# Patient Record
Sex: Female | Born: 1940
Health system: Southern US, Community
[De-identification: ages and names within clinical notes are randomized; demographics above are authoritative.]

## PROBLEM LIST (undated history)

## (undated) DIAGNOSIS — M1990 Unspecified osteoarthritis, unspecified site: Secondary | ICD-10-CM

## (undated) DIAGNOSIS — M810 Age-related osteoporosis without current pathological fracture: Secondary | ICD-10-CM

## (undated) DIAGNOSIS — I10 Essential (primary) hypertension: Secondary | ICD-10-CM

## (undated) DIAGNOSIS — E079 Disorder of thyroid, unspecified: Secondary | ICD-10-CM

## (undated) DIAGNOSIS — M797 Fibromyalgia: Secondary | ICD-10-CM

## (undated) DIAGNOSIS — M353 Polymyalgia rheumatica: Secondary | ICD-10-CM

## (undated) DIAGNOSIS — I739 Peripheral vascular disease, unspecified: Secondary | ICD-10-CM

## (undated) HISTORY — DX: Disorder of thyroid, unspecified: E07.9

## (undated) HISTORY — PX: BREAST LUMPECTOMY: SHX2

## (undated) HISTORY — DX: Polymyalgia rheumatica: M35.3

## (undated) HISTORY — PX: ABDOMINAL HYSTERECTOMY: SHX81

## (undated) HISTORY — DX: Fibromyalgia: M79.7

## (undated) HISTORY — PX: KNEE CARTILAGE SURGERY: SHX688

## (undated) HISTORY — DX: Unspecified osteoarthritis, unspecified site: M19.90

## (undated) HISTORY — DX: Peripheral vascular disease, unspecified: I73.9

## (undated) HISTORY — DX: Essential (primary) hypertension: I10

## (undated) HISTORY — DX: Age-related osteoporosis without current pathological fracture: M81.0

---

## 2000-02-05 ENCOUNTER — Emergency Department (HOSPITAL_COMMUNITY): Admission: EM | Admit: 2000-02-05 | Discharge: 2000-02-05 | Payer: Self-pay | Admitting: Emergency Medicine

## 2000-07-06 ENCOUNTER — Emergency Department (HOSPITAL_COMMUNITY): Admission: EM | Admit: 2000-07-06 | Discharge: 2000-07-06 | Payer: Self-pay

## 2001-01-04 ENCOUNTER — Encounter: Admission: RE | Admit: 2001-01-04 | Discharge: 2001-01-04 | Payer: Self-pay | Admitting: Orthopedic Surgery

## 2001-01-04 ENCOUNTER — Encounter: Payer: Self-pay | Admitting: Orthopedic Surgery

## 2011-09-11 DIAGNOSIS — J019 Acute sinusitis, unspecified: Secondary | ICD-10-CM | POA: Diagnosis not present

## 2011-09-11 DIAGNOSIS — R05 Cough: Secondary | ICD-10-CM | POA: Diagnosis not present

## 2011-09-11 DIAGNOSIS — R059 Cough, unspecified: Secondary | ICD-10-CM | POA: Diagnosis not present

## 2011-10-25 DIAGNOSIS — I10 Essential (primary) hypertension: Secondary | ICD-10-CM | POA: Diagnosis not present

## 2011-10-25 DIAGNOSIS — E785 Hyperlipidemia, unspecified: Secondary | ICD-10-CM | POA: Diagnosis not present

## 2011-10-25 DIAGNOSIS — Z79899 Other long term (current) drug therapy: Secondary | ICD-10-CM | POA: Diagnosis not present

## 2011-10-25 DIAGNOSIS — K219 Gastro-esophageal reflux disease without esophagitis: Secondary | ICD-10-CM | POA: Diagnosis not present

## 2011-10-25 DIAGNOSIS — M353 Polymyalgia rheumatica: Secondary | ICD-10-CM | POA: Diagnosis not present

## 2011-10-25 DIAGNOSIS — E039 Hypothyroidism, unspecified: Secondary | ICD-10-CM | POA: Diagnosis not present

## 2011-11-23 DIAGNOSIS — H1030 Unspecified acute conjunctivitis, unspecified eye: Secondary | ICD-10-CM | POA: Diagnosis not present

## 2011-12-27 DIAGNOSIS — M25569 Pain in unspecified knee: Secondary | ICD-10-CM | POA: Diagnosis not present

## 2011-12-27 DIAGNOSIS — M1710 Unilateral primary osteoarthritis, unspecified knee: Secondary | ICD-10-CM | POA: Diagnosis not present

## 2011-12-29 DIAGNOSIS — M069 Rheumatoid arthritis, unspecified: Secondary | ICD-10-CM | POA: Diagnosis not present

## 2011-12-29 DIAGNOSIS — M353 Polymyalgia rheumatica: Secondary | ICD-10-CM | POA: Diagnosis not present

## 2011-12-29 DIAGNOSIS — Z79899 Other long term (current) drug therapy: Secondary | ICD-10-CM | POA: Diagnosis not present

## 2011-12-29 DIAGNOSIS — M159 Polyosteoarthritis, unspecified: Secondary | ICD-10-CM | POA: Diagnosis not present

## 2012-01-30 DIAGNOSIS — I998 Other disorder of circulatory system: Secondary | ICD-10-CM | POA: Diagnosis not present

## 2012-02-08 DIAGNOSIS — H1030 Unspecified acute conjunctivitis, unspecified eye: Secondary | ICD-10-CM | POA: Diagnosis not present

## 2012-03-16 DIAGNOSIS — I10 Essential (primary) hypertension: Secondary | ICD-10-CM | POA: Diagnosis not present

## 2012-03-16 DIAGNOSIS — R0602 Shortness of breath: Secondary | ICD-10-CM | POA: Diagnosis not present

## 2012-03-22 DIAGNOSIS — R1900 Intra-abdominal and pelvic swelling, mass and lump, unspecified site: Secondary | ICD-10-CM | POA: Diagnosis not present

## 2012-03-23 DIAGNOSIS — R0602 Shortness of breath: Secondary | ICD-10-CM | POA: Diagnosis not present

## 2012-04-03 DIAGNOSIS — R0602 Shortness of breath: Secondary | ICD-10-CM | POA: Diagnosis not present

## 2012-04-27 DIAGNOSIS — E785 Hyperlipidemia, unspecified: Secondary | ICD-10-CM | POA: Diagnosis not present

## 2012-04-27 DIAGNOSIS — F341 Dysthymic disorder: Secondary | ICD-10-CM | POA: Diagnosis not present

## 2012-04-27 DIAGNOSIS — M353 Polymyalgia rheumatica: Secondary | ICD-10-CM | POA: Diagnosis not present

## 2012-04-27 DIAGNOSIS — I10 Essential (primary) hypertension: Secondary | ICD-10-CM | POA: Diagnosis not present

## 2012-04-27 DIAGNOSIS — Z79899 Other long term (current) drug therapy: Secondary | ICD-10-CM | POA: Diagnosis not present

## 2012-04-27 DIAGNOSIS — E039 Hypothyroidism, unspecified: Secondary | ICD-10-CM | POA: Diagnosis not present

## 2012-04-27 DIAGNOSIS — R233 Spontaneous ecchymoses: Secondary | ICD-10-CM | POA: Diagnosis not present

## 2012-05-30 DIAGNOSIS — M25559 Pain in unspecified hip: Secondary | ICD-10-CM | POA: Diagnosis not present

## 2012-05-30 DIAGNOSIS — M25569 Pain in unspecified knee: Secondary | ICD-10-CM | POA: Diagnosis not present

## 2012-05-30 DIAGNOSIS — M1610 Unilateral primary osteoarthritis, unspecified hip: Secondary | ICD-10-CM | POA: Diagnosis not present

## 2012-05-30 DIAGNOSIS — M87059 Idiopathic aseptic necrosis of unspecified femur: Secondary | ICD-10-CM | POA: Diagnosis not present

## 2012-06-06 DIAGNOSIS — M069 Rheumatoid arthritis, unspecified: Secondary | ICD-10-CM | POA: Diagnosis not present

## 2012-06-06 DIAGNOSIS — M1610 Unilateral primary osteoarthritis, unspecified hip: Secondary | ICD-10-CM | POA: Diagnosis not present

## 2012-06-06 DIAGNOSIS — M353 Polymyalgia rheumatica: Secondary | ICD-10-CM | POA: Diagnosis not present

## 2012-06-06 DIAGNOSIS — Z79899 Other long term (current) drug therapy: Secondary | ICD-10-CM | POA: Diagnosis not present

## 2012-06-10 DIAGNOSIS — Z23 Encounter for immunization: Secondary | ICD-10-CM | POA: Diagnosis not present

## 2012-07-28 DIAGNOSIS — H903 Sensorineural hearing loss, bilateral: Secondary | ICD-10-CM | POA: Diagnosis not present

## 2012-09-13 DIAGNOSIS — Z79899 Other long term (current) drug therapy: Secondary | ICD-10-CM | POA: Diagnosis not present

## 2012-09-13 DIAGNOSIS — M069 Rheumatoid arthritis, unspecified: Secondary | ICD-10-CM | POA: Diagnosis not present

## 2012-09-13 DIAGNOSIS — M353 Polymyalgia rheumatica: Secondary | ICD-10-CM | POA: Diagnosis not present

## 2012-10-31 DIAGNOSIS — I10 Essential (primary) hypertension: Secondary | ICD-10-CM | POA: Diagnosis not present

## 2012-10-31 DIAGNOSIS — E039 Hypothyroidism, unspecified: Secondary | ICD-10-CM | POA: Diagnosis not present

## 2012-10-31 DIAGNOSIS — M353 Polymyalgia rheumatica: Secondary | ICD-10-CM | POA: Diagnosis not present

## 2012-10-31 DIAGNOSIS — K219 Gastro-esophageal reflux disease without esophagitis: Secondary | ICD-10-CM | POA: Diagnosis not present

## 2012-10-31 DIAGNOSIS — E785 Hyperlipidemia, unspecified: Secondary | ICD-10-CM | POA: Diagnosis not present

## 2012-10-31 DIAGNOSIS — Z79899 Other long term (current) drug therapy: Secondary | ICD-10-CM | POA: Diagnosis not present

## 2012-11-06 DIAGNOSIS — L0291 Cutaneous abscess, unspecified: Secondary | ICD-10-CM | POA: Diagnosis not present

## 2012-11-06 DIAGNOSIS — J309 Allergic rhinitis, unspecified: Secondary | ICD-10-CM | POA: Diagnosis not present

## 2012-11-06 DIAGNOSIS — L0390 Cellulitis, unspecified: Secondary | ICD-10-CM | POA: Diagnosis not present

## 2012-11-13 DIAGNOSIS — M79609 Pain in unspecified limb: Secondary | ICD-10-CM | POA: Diagnosis not present

## 2012-11-13 DIAGNOSIS — M2040 Other hammer toe(s) (acquired), unspecified foot: Secondary | ICD-10-CM | POA: Diagnosis not present

## 2012-11-13 DIAGNOSIS — B351 Tinea unguium: Secondary | ICD-10-CM | POA: Diagnosis not present

## 2012-11-13 DIAGNOSIS — L608 Other nail disorders: Secondary | ICD-10-CM | POA: Diagnosis not present

## 2012-11-13 DIAGNOSIS — M2010 Hallux valgus (acquired), unspecified foot: Secondary | ICD-10-CM | POA: Diagnosis not present

## 2012-12-26 DIAGNOSIS — M8700 Idiopathic aseptic necrosis of unspecified bone: Secondary | ICD-10-CM | POA: Diagnosis not present

## 2012-12-26 DIAGNOSIS — M1610 Unilateral primary osteoarthritis, unspecified hip: Secondary | ICD-10-CM | POA: Diagnosis not present

## 2012-12-26 DIAGNOSIS — M25559 Pain in unspecified hip: Secondary | ICD-10-CM | POA: Diagnosis not present

## 2012-12-27 DIAGNOSIS — M069 Rheumatoid arthritis, unspecified: Secondary | ICD-10-CM | POA: Diagnosis not present

## 2012-12-27 DIAGNOSIS — M353 Polymyalgia rheumatica: Secondary | ICD-10-CM | POA: Diagnosis not present

## 2012-12-27 DIAGNOSIS — M8700 Idiopathic aseptic necrosis of unspecified bone: Secondary | ICD-10-CM | POA: Diagnosis not present

## 2012-12-27 DIAGNOSIS — Z79899 Other long term (current) drug therapy: Secondary | ICD-10-CM | POA: Diagnosis not present

## 2013-01-06 DIAGNOSIS — A5059 Other late congenital syphilis, symptomatic: Secondary | ICD-10-CM | POA: Diagnosis not present

## 2013-01-07 DIAGNOSIS — R21 Rash and other nonspecific skin eruption: Secondary | ICD-10-CM | POA: Diagnosis not present

## 2013-01-19 DIAGNOSIS — L03039 Cellulitis of unspecified toe: Secondary | ICD-10-CM | POA: Diagnosis not present

## 2013-01-19 DIAGNOSIS — L02619 Cutaneous abscess of unspecified foot: Secondary | ICD-10-CM | POA: Diagnosis not present

## 2013-03-13 DIAGNOSIS — Z23 Encounter for immunization: Secondary | ICD-10-CM | POA: Diagnosis not present

## 2013-03-26 DIAGNOSIS — R109 Unspecified abdominal pain: Secondary | ICD-10-CM | POA: Diagnosis not present

## 2013-04-18 DIAGNOSIS — R109 Unspecified abdominal pain: Secondary | ICD-10-CM | POA: Diagnosis not present

## 2013-04-23 DIAGNOSIS — S8990XA Unspecified injury of unspecified lower leg, initial encounter: Secondary | ICD-10-CM | POA: Diagnosis not present

## 2013-04-23 DIAGNOSIS — S99929A Unspecified injury of unspecified foot, initial encounter: Secondary | ICD-10-CM | POA: Diagnosis not present

## 2013-05-08 ENCOUNTER — Ambulatory Visit (INDEPENDENT_AMBULATORY_CARE_PROVIDER_SITE_OTHER): Payer: Medicare Other | Admitting: Family Medicine

## 2013-05-08 ENCOUNTER — Encounter (INDEPENDENT_AMBULATORY_CARE_PROVIDER_SITE_OTHER): Payer: Self-pay

## 2013-05-08 ENCOUNTER — Encounter: Payer: Self-pay | Admitting: Family Medicine

## 2013-05-08 VITALS — BP 128/64 | HR 83 | Temp 97.0°F | Ht 62.0 in | Wt 100.6 lb

## 2013-05-08 DIAGNOSIS — M069 Rheumatoid arthritis, unspecified: Secondary | ICD-10-CM | POA: Diagnosis not present

## 2013-05-08 DIAGNOSIS — IMO0001 Reserved for inherently not codable concepts without codable children: Secondary | ICD-10-CM | POA: Diagnosis not present

## 2013-05-08 DIAGNOSIS — M1990 Unspecified osteoarthritis, unspecified site: Secondary | ICD-10-CM

## 2013-05-08 DIAGNOSIS — Z13220 Encounter for screening for lipoid disorders: Secondary | ICD-10-CM | POA: Diagnosis not present

## 2013-05-08 DIAGNOSIS — E079 Disorder of thyroid, unspecified: Secondary | ICD-10-CM | POA: Insufficient documentation

## 2013-05-08 DIAGNOSIS — Z1239 Encounter for other screening for malignant neoplasm of breast: Secondary | ICD-10-CM | POA: Insufficient documentation

## 2013-05-08 DIAGNOSIS — M797 Fibromyalgia: Secondary | ICD-10-CM | POA: Insufficient documentation

## 2013-05-08 DIAGNOSIS — M129 Arthropathy, unspecified: Secondary | ICD-10-CM | POA: Diagnosis not present

## 2013-05-08 DIAGNOSIS — Z23 Encounter for immunization: Secondary | ICD-10-CM | POA: Diagnosis not present

## 2013-05-08 DIAGNOSIS — M810 Age-related osteoporosis without current pathological fracture: Secondary | ICD-10-CM | POA: Diagnosis not present

## 2013-05-08 DIAGNOSIS — I10 Essential (primary) hypertension: Secondary | ICD-10-CM | POA: Insufficient documentation

## 2013-05-08 LAB — POCT CBC
Granulocyte percent: 54.3 %G (ref 37–80)
HCT, POC: 39.2 % (ref 37.7–47.9)
Hemoglobin: 13 g/dL (ref 12.2–16.2)
Lymph, poc: 2.4 (ref 0.6–3.4)
MCH, POC: 32.4 pg — AB (ref 27–31.2)
MCHC: 33.3 g/dL (ref 31.8–35.4)
MCV: 97.2 fL — AB (ref 80–97)
MPV: 7.6 fL (ref 0–99.8)
POC Granulocyte: 3.1 (ref 2–6.9)
POC LYMPH PERCENT: 41.8 %L (ref 10–50)
Platelet Count, POC: 239 10*3/uL (ref 142–424)
RBC: 4 M/uL — AB (ref 4.04–5.48)
RDW, POC: 13.1 %
WBC: 5.7 10*3/uL (ref 4.6–10.2)

## 2013-05-08 NOTE — Patient Instructions (Signed)
      Dr Woodroe Mode Recommendations  For nutrition information, I recommend books:  1).Eat to Live by Dr Monico Hoar. 2).Prevent and Reverse Heart Disease by Dr Suzzette Righter. 3) Dr Katherina Right Book:  Program to Reverse Diabetes 4) The [place] Study by Florene Route  Exercise recommendations are:  If unable to walk, then the patient can exercise in a chair 3 times a day. By flapping arms like a bird gently and raising legs outwards to the front.  If ambulatory, the patient can go for walks for 30 minutes 3 times a week. Then increase the intensity and duration as tolerated.  Goal is to try to attain exercise frequency to 5 times a week.  If applicable: Best to perform resistance exercises (machines or weights) 2 days a week and cardio type exercises 3 days per week.   See Dr Dereck Ligas, Gynecologist.  I will schedule mammogram & DEXA.  A small study on Alzheimers at [employer], a serving or 2 of high quality fish weekly, Vit B12 daily, Vitamin B complex daily, Vit D minimum 2000 Units daily, Omega-3 fish oil 3 gm daily. Bigger study being done.  Return in 4months.

## 2013-05-08 NOTE — Progress Notes (Signed)
SUBJECTIVE: CC: Chief Complaint  Patient presents with  . Establish Care    HPI: Tonya Robinson is well known to me who came to re-establish for long term care.she drove all the way from [place]. She brings her summarized notes and needs preventative care and routine follow up. She had labwork Past Medical History  Diagnosis Date  . Tissue necrosis with gangrene in peripheral vascular disease RIGHT HIP  . Polymyalgia rheumatica   . Arthritis   . Hypertension   . Fibromyalgia   . Osteoporosis   . Thyroid disease    Past Surgical History  Procedure Laterality Date  . Knee cartilage surgery Right   . Breast lumpectomy Right   . Abdominal hysterectomy     History   Social History  . Marital Status: Divorced    Spouse Name: N/A    Number of Children: N/A  . Years of Education: N/A   Occupational History  . Not on file.   Social History Main Topics  . Smoking status: Former Smoker -- 0.25 packs/day    Types: Cigarettes  . Smokeless tobacco: Not on file  . Alcohol Use: 0.6 oz/week    1 Glasses of wine per week  . Drug Use: No  . Sexual Activity: Not on file   Other Topics Concern  . Not on file   Social History Narrative  . No narrative on file   Family History  Problem Relation Age of Onset  . Hypertension Mother   . Arthritis Mother   . Heart disease Father   . Arthritis Father    No current outpatient prescriptions on file prior to visit.   No current facility-administered medications on file prior to visit.   Allergies  Allergen Reactions  . Penicillins Rash   Immunization History  Administered Date(s) Administered  . DTaP 04/09/2010  . Pneumococcal Conjugate 05/08/2013  . Pneumococcal Polysaccharide 05/10/2010   Prior to Admission medications   Not on File     ROS: As above in the HPI. All other systems are stable or negative.  OBJECTIVE: APPEARANCE:  Patient in no acute distress.The patient appeared well nourished and normally developed.  Acyanotic. Waist: VITAL SIGNS:BP 128/64  Pulse 83  Temp(Src) 97 F (36.1 C) (Oral)  Ht 5\' 2"  (1.575 m)  Wt 100 lb 9.6 oz (45.632 kg)  BMI 18.40 kg/m2 WF slim built  SKIN: warm and  Dry without overt rashes, tattoos and scars  HEAD and Neck: without JVD, Head and scalp: normal Eyes:No scleral icterus. Fundi normal, eye movements normal. Ears: Auricle normal, canal normal, Tympanic membranes normal, insufflation normal. Nose: normal Throat: normal Neck & thyroid: normal  CHEST & LUNGS: Chest wall: normal Lungs: Clear  CVS: Reveals the PMI to be normally located. Regular rhythm, First and Second Heart sounds are normal,  absence of murmurs, rubs or gallops. Peripheral vasculature: Radial pulses: normal Dorsal pedis pulses: normal Posterior pulses: normal  ABDOMEN:  Appearance: normal Benign, no organomegaly, no masses, no Abdominal Aortic enlargement. No Guarding , no rebound. No Bruits. Bowel sounds: normal  RECTAL: N/A GU: N/A  EXTREMETIES: nonedematous.  MUSCULOSKELETAL:  Spine: normal Joints: intact  NEUROLOGIC: oriented to time,place and person; nonfocal.  ASSESSMENT:  Arthritis  Fibromyalgia  Thyroid disease - Plan: TSH  Osteoporosis - Plan: Vit D  25 hydroxy (rtn osteoporosis monitoring)  Rheumatoid arthritis - Plan: POCT CBC, Sedimentation rate  HTN (hypertension) - Plan: CMP14+EGFR  Screening, lipid - Plan: Lipid panel  Need for prophylactic vaccination against Streptococcus pneumoniae (  pneumococcus) - Plan: Pneumococcal conjugate vaccine 13-valent less than 5yo IM  PLAN:  Orders Placed This Encounter  Procedures  . Pneumococcal conjugate vaccine 13-valent less than 5yo IM  . CMP14+EGFR  . Lipid panel  . TSH  . Sedimentation rate  . Vit D  25 hydroxy (rtn osteoporosis monitoring)  . POCT CBC   Meds ordered this encounter  Medications  . levothyroxine (SYNTHROID, LEVOTHROID) 25 MCG tablet    Sig: Take 25 mcg by mouth daily  before breakfast.  . FLUoxetine (PROZAC) 10 MG capsule    Sig: Take 10 mg by mouth daily.  . hydroxychloroquine (PLAQUENIL) 200 MG tablet    Sig: Take by mouth 2 (two) times daily.  Marland Kitchen DISCONTD: lisinopril (PRINIVIL,ZESTRIL) 10 MG tablet    Sig: Take 10 mg by mouth daily. TAKES 5MG  QD  . predniSONE (DELTASONE) 1 MG tablet    Sig: Take 1 mg by mouth daily. TAKE 3 QD  . estrogens, conjugated, (PREMARIN) 0.625 MG tablet    Sig: Take 0.625 mg by mouth daily. ONE PO Monday through friday  . aspirin 81 MG tablet    Sig: Take 81 mg by mouth daily.  . calcium carbonate (OS-CAL) 600 MG TABS tablet    Sig: Take 600 mg by mouth daily with breakfast.  . fish oil-omega-3 fatty acids 1000 MG capsule    Sig: Take 2 g by mouth daily.  Marland Kitchen zinc gluconate 50 MG tablet    Sig: Take 50 mg by mouth daily.  Marland Kitchen acetaminophen (TYLENOL) 80 MG/0.8ML suspension    Sig: Take 10 mg/kg by mouth every 4 (four) hours as needed for fever.  Marland Kitchen acetaminophen (TYLENOL) 500 MG tablet    Sig: Take 500 mg by mouth every 6 (six) hours as needed for pain.  Marland Kitchen ALPRAZolam (XANAX) 0.25 MG tablet    Sig: Take 0.25 mg by mouth at bedtime as needed for sleep. TAKES ONE QID PRN  . HYDROcodone-acetaminophen (NORCO/VICODIN) 5-325 MG per tablet    Sig: Take 2 tablets by mouth every 6 (six) hours as needed for pain.  Marland Kitchen lisinopril (PRINIVIL,ZESTRIL) 5 MG tablet    Sig: Take 5 mg by mouth daily.   Medications Discontinued During This Encounter  Medication Reason  . lisinopril (PRINIVIL,ZESTRIL) 10 MG tablet Error      Dr Woodroe Mode Recommendations  For nutrition information, I recommend books:  1).Eat to Live by Dr Monico Hoar. 2).Prevent and Reverse Heart Disease by Dr Suzzette Righter. 3) Dr Katherina Right Book:  Program to Reverse Diabetes 4) The [place] Study by Florene Route  Exercise recommendations are:  If unable to walk, then the patient can exercise in a chair 3 times a day. By flapping arms like a bird gently and  raising legs outwards to the front.  If ambulatory, the patient can go for walks for 30 minutes 3 times a week. Then increase the intensity and duration as tolerated.  Goal is to try to attain exercise frequency to 5 times a week.  If applicable: Best to perform resistance exercises (machines or weights) 2 days a week and cardio type exercises 3 days per week.   See Dr Dereck Ligas, Gynecologist.  I will schedule mammogram & DEXA.  A small study on Alzheimers at [employer], a serving or 2 of high quality fish weekly, Vit B12 daily, Vitamin B complex daily, Vit D minimum 2000 Units daily, Omega-3 fish oil 3 gm daily. Bigger study being done.  Return in about 4 months (around 09/05/2013) for Recheck medical problems.  Oluwatobiloba Martin P. [place] Charon, M.D.

## 2013-05-09 LAB — LIPID PANEL
Chol/HDL Ratio: 1.7 ratio units (ref 0.0–4.4)
Cholesterol, Total: 209 mg/dL — ABNORMAL HIGH (ref 100–199)
HDL: 126 mg/dL (ref >39)
LDL Calculated: 68 mg/dL (ref 0–99)
Triglycerides: 76 mg/dL (ref 0–149)
VLDL Cholesterol Cal: 15 mg/dL (ref 5–40)

## 2013-05-09 LAB — CMP14+EGFR
ALT: 12 IU/L (ref 0–32)
AST: 24 IU/L (ref 0–40)
Albumin/Globulin Ratio: 2.4 (ref 1.1–2.5)
Albumin: 4.1 g/dL (ref 3.5–4.8)
Alkaline Phosphatase: 37 IU/L — ABNORMAL LOW (ref 39–117)
BUN/Creatinine Ratio: 18 (ref 11–26)
BUN: 16 mg/dL (ref 8–27)
CO2: 26 mmol/L (ref 18–29)
Calcium: 9.5 mg/dL (ref 8.6–10.2)
Chloride: 100 mmol/L (ref 97–108)
Creatinine, Ser: 0.91 mg/dL (ref 0.57–1.00)
GFR calc Af Amer: 73 mL/min/{1.73_m2} (ref >59)
GFR calc non Af Amer: 64 mL/min/{1.73_m2} (ref >59)
Globulin, Total: 1.7 g/dL (ref 1.5–4.5)
Glucose: 94 mg/dL (ref 65–99)
Potassium: 4.2 mmol/L (ref 3.5–5.2)
Sodium: 141 mmol/L (ref 134–144)
Total Bilirubin: 0.6 mg/dL (ref 0.0–1.2)
Total Protein: 5.8 g/dL — ABNORMAL LOW (ref 6.0–8.5)

## 2013-05-09 LAB — TSH: TSH: 3.35 u[IU]/mL (ref 0.450–4.500)

## 2013-05-09 LAB — VITAMIN D 25 HYDROXY (VIT D DEFICIENCY, FRACTURES): Vit D, 25-Hydroxy: 53.4 ng/mL (ref 30.0–100.0)

## 2013-05-09 LAB — SEDIMENTATION RATE: Sed Rate: 2 mm/hr (ref 0–40)

## 2013-05-11 ENCOUNTER — Encounter: Payer: Self-pay | Admitting: Family Medicine

## 2013-05-14 ENCOUNTER — Encounter: Payer: Self-pay | Admitting: Family Medicine

## 2013-05-15 ENCOUNTER — Telehealth: Payer: Self-pay

## 2013-05-15 DIAGNOSIS — Z9229 Personal history of other drug therapy: Secondary | ICD-10-CM

## 2013-05-15 NOTE — Telephone Encounter (Signed)
Pt emailed and wants referral to Dr Cherly Hensen for GYN  Per Dr Modesto Charon --pt has not had GYN in logn time and been on hormones /estrogen for long period of time and also on planquenil and prednisone.

## 2013-05-29 DIAGNOSIS — M069 Rheumatoid arthritis, unspecified: Secondary | ICD-10-CM | POA: Diagnosis not present

## 2013-05-29 DIAGNOSIS — M353 Polymyalgia rheumatica: Secondary | ICD-10-CM | POA: Diagnosis not present

## 2013-05-29 DIAGNOSIS — M25559 Pain in unspecified hip: Secondary | ICD-10-CM | POA: Diagnosis not present

## 2013-05-29 DIAGNOSIS — Z79899 Other long term (current) drug therapy: Secondary | ICD-10-CM | POA: Diagnosis not present

## 2013-06-19 DIAGNOSIS — Z79890 Hormone replacement therapy (postmenopausal): Secondary | ICD-10-CM | POA: Diagnosis not present

## 2013-06-19 DIAGNOSIS — R350 Frequency of micturition: Secondary | ICD-10-CM | POA: Diagnosis not present

## 2013-06-19 DIAGNOSIS — M810 Age-related osteoporosis without current pathological fracture: Secondary | ICD-10-CM | POA: Diagnosis not present

## 2013-07-10 DIAGNOSIS — Z1231 Encounter for screening mammogram for malignant neoplasm of breast: Secondary | ICD-10-CM | POA: Diagnosis not present

## 2013-07-10 DIAGNOSIS — Z8262 Family history of osteoporosis: Secondary | ICD-10-CM | POA: Diagnosis not present

## 2013-07-10 DIAGNOSIS — Z13820 Encounter for screening for osteoporosis: Secondary | ICD-10-CM | POA: Diagnosis not present

## 2013-07-27 ENCOUNTER — Encounter: Payer: Self-pay | Admitting: Family Medicine

## 2013-07-30 ENCOUNTER — Other Ambulatory Visit: Payer: Self-pay | Admitting: Family Medicine

## 2013-07-30 MED ORDER — FLUOXETINE HCL 10 MG PO CAPS: 10.0000 mg | ORAL_CAPSULE | Freq: Every day | ORAL | Status: DC

## 2013-07-30 MED ORDER — ALPRAZOLAM 0.25 MG PO TABS: ORAL_TABLET | ORAL | Status: DC

## 2013-09-06 ENCOUNTER — Encounter: Payer: Self-pay | Admitting: Family Medicine

## 2013-09-06 ENCOUNTER — Ambulatory Visit (INDEPENDENT_AMBULATORY_CARE_PROVIDER_SITE_OTHER): Payer: Medicare Other | Admitting: Family Medicine

## 2013-09-06 VITALS — BP 132/69 | HR 93 | Temp 97.3°F | Ht 62.0 in | Wt 99.0 lb

## 2013-09-06 DIAGNOSIS — I10 Essential (primary) hypertension: Secondary | ICD-10-CM | POA: Diagnosis not present

## 2013-09-06 DIAGNOSIS — M129 Arthropathy, unspecified: Secondary | ICD-10-CM

## 2013-09-06 DIAGNOSIS — M810 Age-related osteoporosis without current pathological fracture: Secondary | ICD-10-CM | POA: Diagnosis not present

## 2013-09-06 DIAGNOSIS — M797 Fibromyalgia: Secondary | ICD-10-CM

## 2013-09-06 DIAGNOSIS — E079 Disorder of thyroid, unspecified: Secondary | ICD-10-CM

## 2013-09-06 DIAGNOSIS — IMO0001 Reserved for inherently not codable concepts without codable children: Secondary | ICD-10-CM | POA: Diagnosis not present

## 2013-09-06 DIAGNOSIS — M1990 Unspecified osteoarthritis, unspecified site: Secondary | ICD-10-CM

## 2013-09-06 DIAGNOSIS — M069 Rheumatoid arthritis, unspecified: Secondary | ICD-10-CM

## 2013-09-06 LAB — POCT CBC
Granulocyte percent: 54.2 %G (ref 37–80)
HCT, POC: 42.1 % (ref 37.7–47.9)
Hemoglobin: 13.5 g/dL (ref 12.2–16.2)
Lymph, poc: 2.2 (ref 0.6–3.4)
MCH, POC: 31.3 pg — AB (ref 27–31.2)
MCHC: 32.2 g/dL (ref 31.8–35.4)
MCV: 97.3 fL — AB (ref 80–97)
MPV: 6.9 fL (ref 0–99.8)
POC Granulocyte: 3.1 (ref 2–6.9)
POC LYMPH PERCENT: 38.6 %L (ref 10–50)
Platelet Count, POC: 258 10*3/uL (ref 142–424)
RBC: 4.3 M/uL (ref 4.04–5.48)
RDW, POC: 12.7 %
WBC: 5.8 10*3/uL (ref 4.6–10.2)

## 2013-09-06 MED ORDER — LISINOPRIL 5 MG PO TABS: 5.0000 mg | ORAL_TABLET | Freq: Every day | ORAL | Status: DC

## 2013-09-06 MED ORDER — LEVOTHYROXINE SODIUM 25 MCG PO TABS: 25.0000 ug | ORAL_TABLET | Freq: Every day | ORAL | Status: AC

## 2013-09-06 NOTE — Progress Notes (Signed)
Patient ID: Tonya Robinson, female   DOB: 1940-09-01, 73 y.o.   MRN: [id number] SUBJECTIVE: CC: Chief Complaint  Patient presents with  . Hypertension    4 MONTH RECHECK  . Hypothyroidism  . Medication Refill    HPI:  Right hip osteonecrosis is acting up with the cold weather. Going to [place] to see her orthopedist. [name] it is time to have a hip replacement  Off of her HRT. Saw the GYNecologist. She was very appreciative of the referral. Her for follow up of her hypothyroidism. Doing well with that. In tregards to her Rheumatologic problems, that has been ongoing. It is time to come off the plaquenil.  She is planning to see her rheumatologist.  Patient is here for follow up of hypertension: denies Headache;deniesChest Pain;denies weakness;denies Shortness of Breath or Orthopnea;denies Visual changes;denies palpitations;denies cough;denies pedal edema;denies symptoms of TIA or stroke; admits to Compliance with medications. denies Problems with medications.  Right thumb hurts.along the dorsal aspect of the thumb and the extensor tendon.  Noticed cold intolerance.  Past Medical History  Diagnosis Date  . Tissue necrosis with gangrene in peripheral vascular disease RIGHT HIP  . Polymyalgia rheumatica   . Arthritis   . Hypertension   . Fibromyalgia   . Osteoporosis   . Thyroid disease    Past Surgical History  Procedure Laterality Date  . Knee cartilage surgery Right   . Breast lumpectomy Right   . Abdominal hysterectomy     History   Social History  . Marital Status: Divorced    Spouse Name: N/A    Number of Children: N/A  . Years of Education: N/A   Occupational History  . Not on file.   Social History Main Topics  . Smoking status: Former Smoker -- 0.25 packs/day    Types: Cigarettes  . Smokeless tobacco: Not on file  . Alcohol Use: 0.6 oz/week    1 Glasses of wine per week  . Drug Use: No  . Sexual Activity: Not on file   Other Topics Concern  . Not  on file   Social History Narrative  . No narrative on file   Family History  Problem Relation Age of Onset  . Hypertension Mother   . Arthritis Mother   . Heart disease Father   . Arthritis Father    Current Outpatient Prescriptions on File Prior to Visit  Medication Sig Dispense Refill  . acetaminophen (TYLENOL) 500 MG tablet Take 500 mg by mouth every 6 (six) hours as needed for pain.      Marland Kitchen ALPRAZolam (XANAX) 0.25 MG tablet Take 1 tab tid prn anxiety and hs prn insomnia.  120 tablet  0  . aspirin 81 MG tablet Take 81 mg by mouth daily.      . calcium carbonate (OS-CAL) 600 MG TABS tablet Take 600 mg by mouth 2 (two) times daily with a meal.       . fish oil-omega-3 fatty acids 1000 MG capsule Take 2 g by mouth daily.      Marland Kitchen FLUoxetine (PROZAC) 10 MG capsule Take 1 capsule (10 mg total) by mouth daily.  90 capsule  1  . HYDROcodone-acetaminophen (NORCO/VICODIN) 5-325 MG per tablet Take 2 tablets by mouth every 6 (six) hours as needed for pain.      . hydroxychloroquine (PLAQUENIL) 200 MG tablet Take by mouth 2 (two) times daily.      . predniSONE (DELTASONE) 1 MG tablet Take 1 mg by mouth daily. TAKE  3 QD      . zinc gluconate 50 MG tablet Take 50 mg by mouth daily.      Marland Kitchen estrogens, conjugated, (PREMARIN) 0.625 MG tablet Take 0.625 mg by mouth daily. ONE PO Monday through friday       No current facility-administered medications on file prior to visit.   Allergies  Allergen Reactions  . Penicillins Rash   Immunization History  Administered Date(s) Administered  . DTaP 04/09/2010  . Pneumococcal Conjugate-13 05/08/2013  . Pneumococcal Polysaccharide-23 05/10/2010   Prior to Admission medications   Medication Sig Start Date End Date Taking? Authorizing Provider  acetaminophen (TYLENOL) 500 MG tablet Take 500 mg by mouth every 6 (six) hours as needed for pain.   Yes Historical Provider, MD  ALPRAZolam Duanne Moron) 0.25 MG tablet Take 1 tab tid prn anxiety and hs prn insomnia.  07/30/13  Yes Vernie Shanks, MD  aspirin 81 MG tablet Take 81 mg by mouth daily.   Yes Historical Provider, MD  calcium carbonate (OS-CAL) 600 MG TABS tablet Take 600 mg by mouth 2 (two) times daily with a meal.    Yes Historical Provider, MD  fish oil-omega-3 fatty acids 1000 MG capsule Take 2 g by mouth daily.   Yes Historical Provider, MD  FLUoxetine (PROZAC) 10 MG capsule Take 1 capsule (10 mg total) by mouth daily. 07/30/13  Yes Vernie Shanks, MD  HYDROcodone-acetaminophen (NORCO/VICODIN) 5-325 MG per tablet Take 2 tablets by mouth every 6 (six) hours as needed for pain.   Yes Historical Provider, MD  hydroxychloroquine (PLAQUENIL) 200 MG tablet Take by mouth 2 (two) times daily.   Yes Historical Provider, MD  levothyroxine (SYNTHROID, LEVOTHROID) 25 MCG tablet Take 25 mcg by mouth daily before breakfast.   Yes Historical Provider, MD  lisinopril (PRINIVIL,ZESTRIL) 5 MG tablet Take 5 mg by mouth daily.   Yes Historical Provider, MD  predniSONE (DELTASONE) 1 MG tablet Take 1 mg by mouth daily. TAKE 3 QD   Yes Historical Provider, MD  zinc gluconate 50 MG tablet Take 50 mg by mouth daily.   Yes Historical Provider, MD  estrogens, conjugated, (PREMARIN) 0.625 MG tablet Take 0.625 mg by mouth daily. ONE PO Monday through [place] Provider, MD     ROS: As above in the HPI. All other systems are stable or negative.  OBJECTIVE: APPEARANCE:  Patient in no acute distress.The patient appeared well nourished and normally developed. Acyanotic. Waist: VITAL SIGNS:BP 132/69  Pulse 93  Temp(Src) 97.3 F (36.3 C) (Oral)  Ht '5\' 2"'  (1.575 m)  Wt 99 lb (44.906 kg)  BMI 18.10 kg/m2  Thin WF SKIN: warm and  Dry without overt rashes, tattoos and scars  HEAD and Neck: without JVD, Head and scalp: normal Eyes:No scleral icterus. Fundi normal, eye movements normal. Ears: Auricle normal, canal normal, Tympanic membranes normal, insufflation normal. Nose: normal Throat: normal Neck &  thyroid: normal  CHEST & LUNGS: Chest wall: normal Lungs: Clear  CVS: Reveals the PMI to be normally located. Regular rhythm, First and Second Heart sounds are normal,  absence of murmurs, rubs or gallops. Peripheral vasculature: Radial pulses: normal Dorsal pedis pulses: normal Posterior pulses: normal  ABDOMEN:  Appearance: normal Benign, no organomegaly, no masses, no Abdominal Aortic enlargement. No Guarding , no rebound. No Bruits. Bowel sounds: normal  RECTAL: N/A GU: N/A  EXTREMETIES: nonedematous.  MUSCULOSKELETAL:  Arthritic changes. Right hip has marked reduction in the ROM Right thumb extensor thendon and the  wrist joint is tender   NEUROLOGIC: oriented to time,place and person; nonfocal. Strength is normal Sensory is normal Reflexes are normal Cranial Nerves are normal.  ASSESSMENT:  Arthritis - Plan: POCT CBC, Sedimentation rate, CMP14+EGFR  Fibromyalgia - Plan: Vit D  25 hydroxy (rtn osteoporosis monitoring)  Thyroid disease - Plan: TSH, CMP14+EGFR  HTN (hypertension) - Plan: CMP14+EGFR  Osteoporosis - Plan: Vit D  25 hydroxy (rtn osteoporosis monitoring)  Rheumatoid arthritis Suspect Dequervain's tenosinovitis. Cold intolerance. Await the TSH ;level today.  PLAN: She is on her way to see her orthopedict and rhgeumatologist in [place]. Will follow up with them in the meantime she will use the voltaren Gel to the right thumb.  Continue oth r medications. Await labs.   Orders Placed This Encounter  Procedures  . Sedimentation rate  . Vit D  25 hydroxy (rtn osteoporosis monitoring)  . TSH  . CMP14+EGFR  . POCT CBC   Meds ordered this encounter  Medications  . levothyroxine (SYNTHROID, LEVOTHROID) 25 MCG tablet    Sig: Take 1 tablet (25 mcg total) by mouth daily before breakfast.    Dispense:  90 tablet    Refill:  2  . lisinopril (PRINIVIL,ZESTRIL) 5 MG tablet    Sig: Take 1 tablet (5 mg total) by mouth daily.    Dispense:  90 tablet     Refill:  2   Medications Discontinued During This Encounter  Medication Reason  . acetaminophen (TYLENOL) 80 MG/0.8ML suspension Entry Error  . levothyroxine (SYNTHROID, LEVOTHROID) 25 MCG tablet Reorder  . lisinopril (PRINIVIL,ZESTRIL) 5 MG tablet Reorder   Return in about 4 months (around 01/06/2014) for Recheck medical problems.  Vance Belcourt P. Jacelyn Grip, M.D.

## 2013-09-07 LAB — CMP14+EGFR
ALT: 18 IU/L (ref 0–32)
AST: 34 IU/L (ref 0–40)
Albumin/Globulin Ratio: 2.8 — ABNORMAL HIGH (ref 1.1–2.5)
Albumin: 4.5 g/dL (ref 3.5–4.8)
Alkaline Phosphatase: 51 IU/L (ref 39–117)
BUN/Creatinine Ratio: 16 (ref 11–26)
BUN: 15 mg/dL (ref 8–27)
CO2: 26 mmol/L (ref 18–29)
Calcium: 9.6 mg/dL (ref 8.7–10.3)
Chloride: 99 mmol/L (ref 97–108)
Creatinine, Ser: 0.92 mg/dL (ref 0.57–1.00)
GFR calc Af Amer: 72 mL/min/{1.73_m2} (ref >59)
GFR calc non Af Amer: 63 mL/min/{1.73_m2} (ref >59)
Globulin, Total: 1.6 g/dL (ref 1.5–4.5)
Glucose: 80 mg/dL (ref 65–99)
Potassium: 3.9 mmol/L (ref 3.5–5.2)
Sodium: 142 mmol/L (ref 134–144)
Total Bilirubin: 0.7 mg/dL (ref 0.0–1.2)
Total Protein: 6.1 g/dL (ref 6.0–8.5)

## 2013-09-07 LAB — TSH: TSH: 3.84 u[IU]/mL (ref 0.450–4.500)

## 2013-09-07 LAB — VITAMIN D 25 HYDROXY (VIT D DEFICIENCY, FRACTURES): Vit D, 25-Hydroxy: 60.7 ng/mL (ref 30.0–100.0)

## 2013-09-07 LAB — SEDIMENTATION RATE: Sed Rate: 2 mm/hr (ref 0–40)

## 2013-09-07 NOTE — Progress Notes (Signed)
Quick Note:  Call patient. Labs normal. No change in plan. ______ 

## 2013-09-11 DIAGNOSIS — M1610 Unilateral primary osteoarthritis, unspecified hip: Secondary | ICD-10-CM | POA: Diagnosis not present

## 2013-09-11 DIAGNOSIS — M25559 Pain in unspecified hip: Secondary | ICD-10-CM | POA: Diagnosis not present

## 2013-09-11 DIAGNOSIS — M25569 Pain in unspecified knee: Secondary | ICD-10-CM | POA: Diagnosis not present

## 2013-09-17 ENCOUNTER — Other Ambulatory Visit: Payer: Self-pay

## 2013-10-03 DIAGNOSIS — H109 Unspecified conjunctivitis: Secondary | ICD-10-CM | POA: Diagnosis not present

## 2013-10-19 ENCOUNTER — Encounter: Payer: Self-pay | Admitting: Family Medicine

## 2013-10-29 ENCOUNTER — Encounter: Payer: Self-pay | Admitting: Family Medicine

## 2013-11-03 ENCOUNTER — Encounter: Payer: Self-pay | Admitting: Family Medicine

## 2013-11-03 DIAGNOSIS — H109 Unspecified conjunctivitis: Secondary | ICD-10-CM | POA: Diagnosis not present

## 2013-11-06 DIAGNOSIS — D649 Anemia, unspecified: Secondary | ICD-10-CM | POA: Diagnosis not present

## 2013-11-06 DIAGNOSIS — Z01818 Encounter for other preprocedural examination: Secondary | ICD-10-CM | POA: Diagnosis not present

## 2013-11-06 DIAGNOSIS — M25559 Pain in unspecified hip: Secondary | ICD-10-CM | POA: Diagnosis not present

## 2013-11-20 ENCOUNTER — Encounter: Payer: Self-pay | Admitting: Family

## 2013-11-20 ENCOUNTER — Ambulatory Visit (INDEPENDENT_AMBULATORY_CARE_PROVIDER_SITE_OTHER): Payer: Medicare Other | Admitting: Family

## 2013-11-20 ENCOUNTER — Ambulatory Visit (INDEPENDENT_AMBULATORY_CARE_PROVIDER_SITE_OTHER): Payer: Medicare Other

## 2013-11-20 VITALS — BP 123/67 | HR 95 | Temp 98.0°F | Ht 62.0 in | Wt 98.0 lb

## 2013-11-20 DIAGNOSIS — M1611 Unilateral primary osteoarthritis, right hip: Secondary | ICD-10-CM

## 2013-11-20 DIAGNOSIS — Z01818 Encounter for other preprocedural examination: Secondary | ICD-10-CM

## 2013-11-20 DIAGNOSIS — M169 Osteoarthritis of hip, unspecified: Secondary | ICD-10-CM | POA: Diagnosis not present

## 2013-11-20 DIAGNOSIS — M1610 Unilateral primary osteoarthritis, unspecified hip: Secondary | ICD-10-CM | POA: Diagnosis not present

## 2013-11-20 LAB — POCT CBC
Granulocyte percent: 64 %G (ref 37–80)
HCT, POC: 37.3 % — AB (ref 37.7–47.9)
Hemoglobin: 12.9 g/dL (ref 12.2–16.2)
Lymph, poc: 1.8 (ref 0.6–3.4)
MCH, POC: 33.8 pg — AB (ref 27–31.2)
MCHC: 34.8 g/dL (ref 31.8–35.4)
MCV: 97.3 fL — AB (ref 80–97)
MPV: 6.8 fL (ref 0–99.8)
POC Granulocyte: 3.8 (ref 2–6.9)
POC LYMPH PERCENT: 30.2 %L (ref 10–50)
Platelet Count, POC: 242 10*3/uL (ref 142–424)
RBC: 3.8 M/uL — AB (ref 4.04–5.48)
RDW, POC: 12.4 %
WBC: 6 10*3/uL (ref 4.6–10.2)

## 2013-11-20 IMAGING — CR DG CHEST 2V
2 series · 2 of 2 positions shown · non-contrast
Comparison: None.

CLINICAL DATA: Preop.

EXAM:
CHEST  2 VIEW

[view not recorded (1 of 2)]
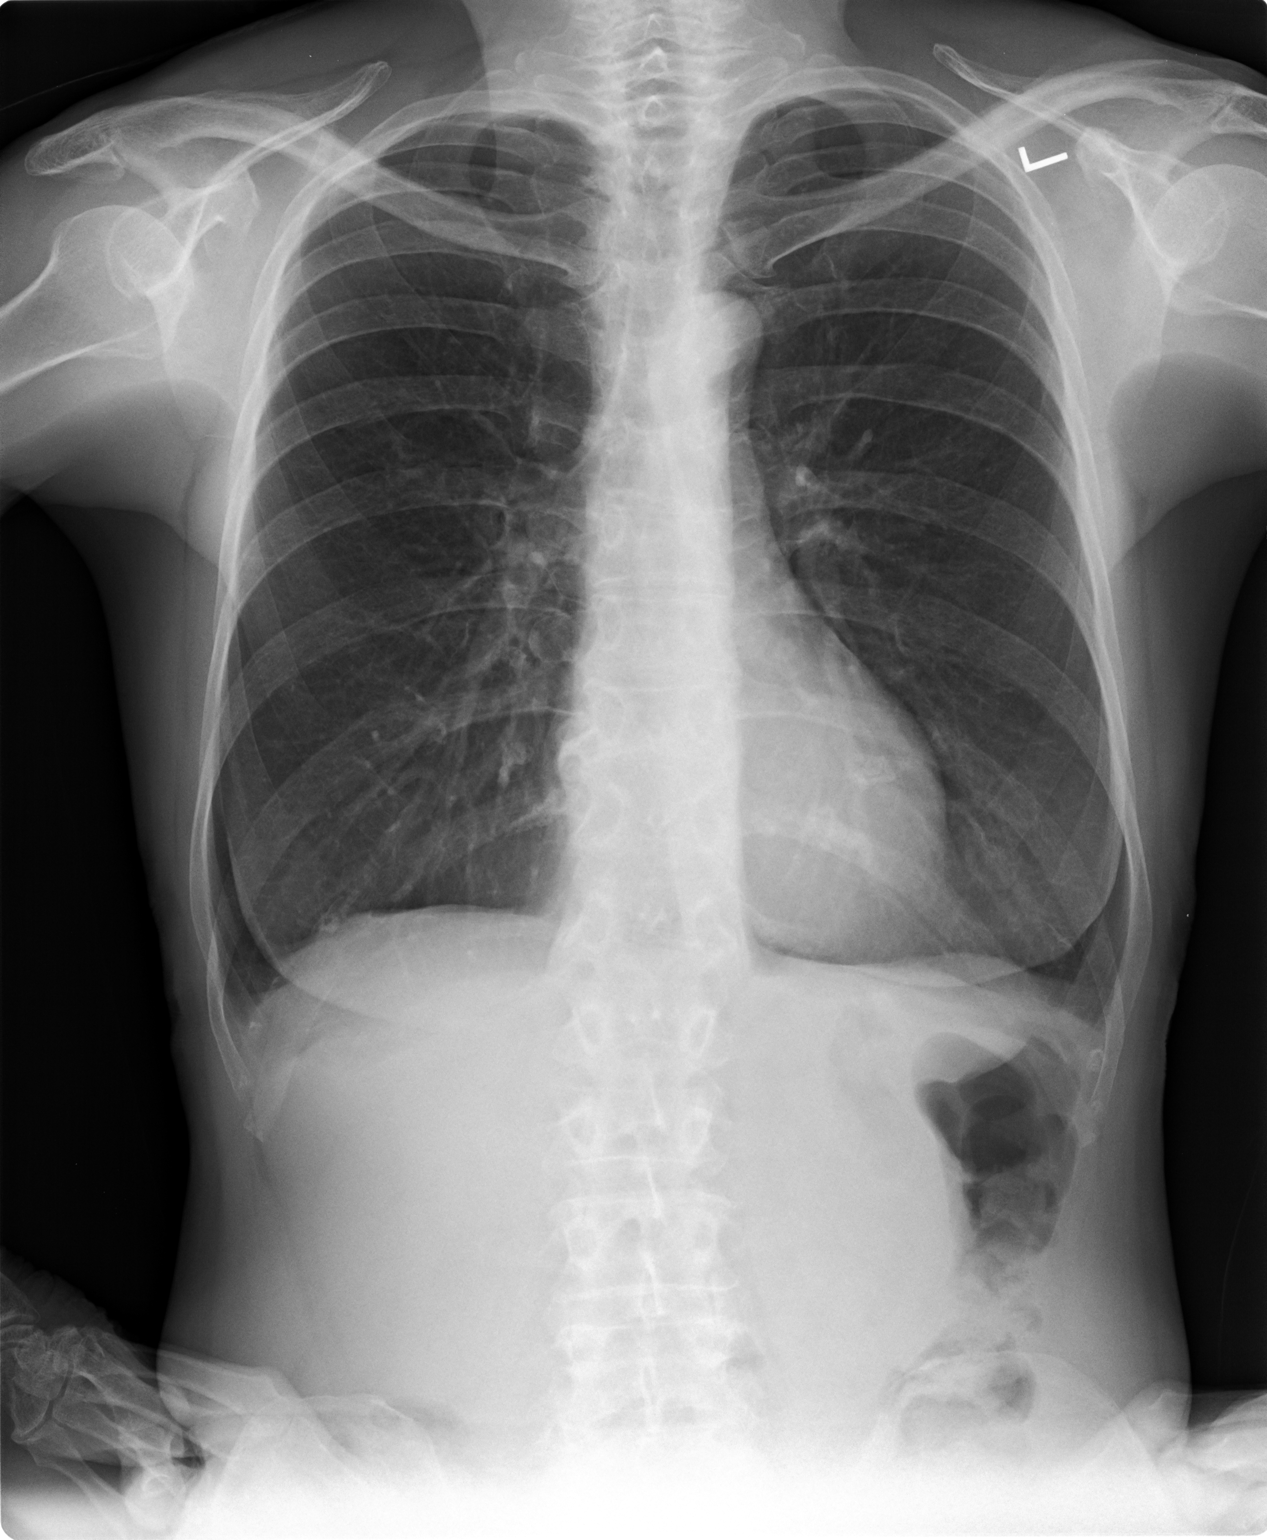

[view not recorded (2 of 2)]
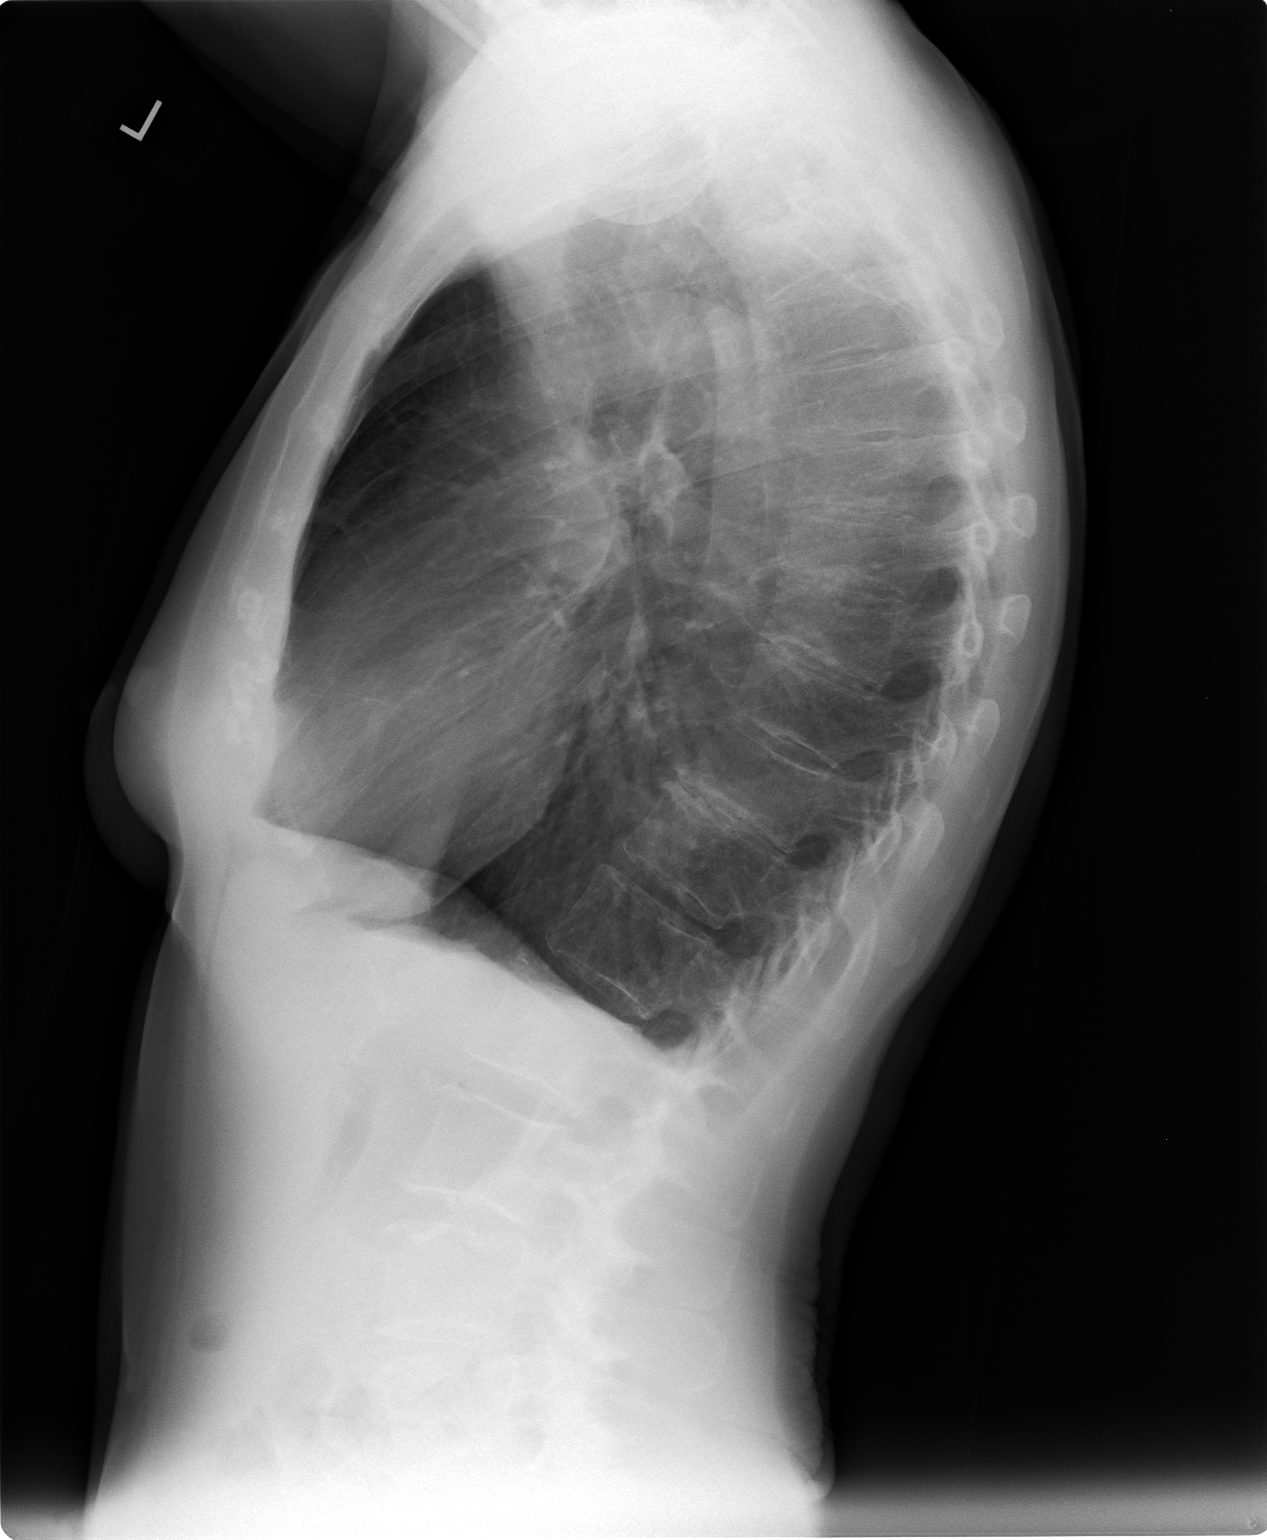

[2 of 2 positions shown; findings below may reference images not displayed]

FINDINGS: Lungs are hyperexpanded with flattened hemidiaphragms consistent
with COPD. Lungs are clear. No pleural effusion. No pneumothorax.

Normal heart, mediastinum and hila.

Bony thorax is demineralized but intact.
IMPRESSION: 1. No acute cardiopulmonary disease.
2. COPD.

## 2013-11-20 MED ORDER — HYDROCODONE-ACETAMINOPHEN 5-325 MG PO TABS: 2.0000 | ORAL_TABLET | Freq: Four times a day (QID) | ORAL | Status: DC | PRN

## 2013-11-20 NOTE — Progress Notes (Signed)
   Subjective:    Patient ID: Tonya Robinson, female    DOB: 03/04/1941, 73 y.o.   MRN: [id number]  HPI Pt presents to office for surgical clearance for a total right hip replacement in [place]. Pt has family up there that will be taking care of her after surgery.  Pt's medical problems include: Patient Active Problem List   Diagnosis Date Noted  . Rheumatoid arthritis 05/08/2013  . Breast cancer screening 05/08/2013  . Need for prophylactic vaccination against Streptococcus pneumoniae (pneumococcus) 05/08/2013  . Screening, lipid 05/08/2013  . HTN (hypertension) 05/08/2013  . Arthritis   . Fibromyalgia   . Osteoporosis   . Thyroid disease    Allergies  Allergen Reactions  . Penicillins Rash      Review of Systems  Musculoskeletal: Positive for gait problem and joint swelling.  All other systems reviewed and are negative.      Objective:   Physical Exam  Vitals reviewed. Constitutional: She is oriented to person, place, and time. She appears well-developed and well-nourished. No distress.  Eyes: Pupils are equal, round, and reactive to light.  Cardiovascular: Normal rate, regular rhythm, normal heart sounds and intact distal pulses.   No murmur heard. Pulmonary/Chest: Effort normal and breath sounds normal. No respiratory distress. She has no wheezes.  Abdominal: Soft. Bowel sounds are normal. She exhibits no distension. There is no tenderness.  Musculoskeletal: Normal range of motion. She exhibits no edema and no tenderness.  Decrease ROM of right hip due to pain   Neurological: She is alert and oriented to person, place, and time. She has normal reflexes. No cranial nerve deficit.  Skin: Skin is warm and dry.  Psychiatric: She has a normal mood and affect. Her behavior is normal. Judgment and thought content normal.    BP 123/67  Pulse 95  Temp(Src) 98 F (36.7 C) (Oral)  Ht _0  (1.575 m)  Wt 98 lb (44.453 kg)  BMI 17.92 kg/m2  Preliminary reading by  Evelina Dun, FNP [facility] X-ray WNL     Assessment & Plan:  1. Preoperative clearance - EKG 12-Lead - DG Chest 2 View; Future - POCT CBC - BMP8+EGFR - DG Chest 2 View; Future -Keep all appointments -Stop medications as prescribed per MD prior to sugery  2. Osteoarthritis of right hip - HYDROcodone-acetaminophen (NORCO/VICODIN) 5-325 MG per tablet; Take 2 tablets by mouth every 6 (six) hours as needed.  Dispense: 30 tablet; Refill: 0  Evelina Dun, FNP

## 2013-11-20 NOTE — Patient Instructions (Signed)
Total Hip Replacement Total hip replacement is the replacement of your damaged hip with an artificial hip joint (prosthetic hip joint). The purpose of this surgery is to reduce pain and improve your hip function. LET YOUR CAREGIVER KNOW ABOUT:   Any allergies you have.  Any medicines you are taking, including vitamins, herbs, eyedrops, over-the-counter medicines, and creams.  Any problems you have had with the use of anesthetics.  Family history of problems with the use of anesthetics.  Any blood disorders you have, including bleeding problems or clotting problems.  Previous surgeries you have had. RISKS AND COMPLICATIONS Generally, total hip replacement is a safe procedure. However, as with any surgical procedure, complications can occur. Complications associated with total hip replacement both during and after the procedure include:  Infection.  Dislocation (the ball of the hip-joint prosthesis comes out of contact with the socket).  Loosening of the stem connected to the ball or socket.  Fracture of the bone while inserting the prosthesis.  Formation of blood clots, which can break loose and travel to and injure your lungs (pulmonary embolus). BEFORE THE PROCEDURE   Your caregiver will instruct you when you need to stop eating and drinking.  Ask your caregiver if you need to change or stop any regular medicines. PROCEDURE Just before the procedure, you will receive medicine that will make you drowsy (sedative) or medicine to make you fall asleep (general anesthetic). This will be given through a tube that is inserted into one of your veins (intravenous [IV] tube). Then you will receive medicine to block pain from the waist down through your legs (spinal block). An incision is made in your hip. Your surgeon will take out any damaged cartilage and bone. Next, your surgeon will insert a prosthetic socket into your pelvic bone. This is usually secured with screws. Then, your surgeon  will cut off the ball of your thigh bone (femur) and attach a prosthetic ball on a stem to your femur. The surgeon then places the ball into the socket and checks the range of motion of your new hip. AFTER THE PROCEDURE  You will be taken to the recovery area where a nurse will watch and check your progress. Once you are awake and stable, you will be taken to a hospital room. You will receive physical therapy until you are doing well and your caregiver feels it is safe for you to go home. Typically, you will stay in the hospital 1 4 days after your procedure. Document Released: 09/27/2000 Document Revised: 12/21/2011 Document Reviewed: 08/08/2011 [facility] Patient Information 2014 [place].

## 2013-11-21 ENCOUNTER — Telehealth: Payer: Self-pay | Admitting: Family Medicine

## 2013-11-21 LAB — BMP8+EGFR
BUN/Creatinine Ratio: 17 (ref 11–26)
BUN: 15 mg/dL (ref 8–27)
CO2: 26 mmol/L (ref 18–29)
Calcium: 10 mg/dL (ref 8.7–10.3)
Chloride: 100 mmol/L (ref 97–108)
Creatinine, Ser: 0.9 mg/dL (ref 0.57–1.00)
GFR calc Af Amer: 74 mL/min/{1.73_m2} (ref >59)
GFR calc non Af Amer: 64 mL/min/{1.73_m2} (ref >59)
Glucose: 96 mg/dL (ref 65–99)
Potassium: 4 mmol/L (ref 3.5–5.2)
Sodium: 140 mmol/L (ref 134–144)

## 2013-11-21 NOTE — Telephone Encounter (Signed)
Message copied by Waverly Ferrari on Wed Nov 21, 2013 10:21 AM ------      Message from: Lenna Gilford, [place] A      Created: Wed Nov 21, 2013  8:43 AM       WBC, HgB, & plts WNL      Kidney and liver function stable      Please send these results with her chest x-ray, EKG to her MD in [place] who will do her hip surgery ------

## 2013-11-28 ENCOUNTER — Encounter: Payer: Self-pay | Admitting: Family

## 2013-11-28 ENCOUNTER — Encounter: Payer: Self-pay | Admitting: *Deleted

## 2013-12-04 DIAGNOSIS — F3289 Other specified depressive episodes: Secondary | ICD-10-CM | POA: Diagnosis present

## 2013-12-04 DIAGNOSIS — M1610 Unilateral primary osteoarthritis, unspecified hip: Secondary | ICD-10-CM | POA: Diagnosis not present

## 2013-12-04 DIAGNOSIS — M25559 Pain in unspecified hip: Secondary | ICD-10-CM | POA: Diagnosis not present

## 2013-12-04 DIAGNOSIS — M353 Polymyalgia rheumatica: Secondary | ICD-10-CM | POA: Diagnosis present

## 2013-12-04 DIAGNOSIS — T84029A Dislocation of unspecified internal joint prosthesis, initial encounter: Secondary | ICD-10-CM | POA: Diagnosis not present

## 2013-12-04 DIAGNOSIS — M169 Osteoarthritis of hip, unspecified: Secondary | ICD-10-CM | POA: Diagnosis present

## 2013-12-04 DIAGNOSIS — J449 Chronic obstructive pulmonary disease, unspecified: Secondary | ICD-10-CM | POA: Diagnosis present

## 2013-12-04 DIAGNOSIS — IMO0002 Reserved for concepts with insufficient information to code with codable children: Secondary | ICD-10-CM | POA: Diagnosis not present

## 2013-12-04 DIAGNOSIS — Z880 Allergy status to penicillin: Secondary | ICD-10-CM | POA: Diagnosis not present

## 2013-12-04 DIAGNOSIS — E039 Hypothyroidism, unspecified: Secondary | ICD-10-CM | POA: Diagnosis present

## 2013-12-04 DIAGNOSIS — F329 Major depressive disorder, single episode, unspecified: Secondary | ICD-10-CM | POA: Diagnosis present

## 2013-12-04 DIAGNOSIS — Z79899 Other long term (current) drug therapy: Secondary | ICD-10-CM | POA: Diagnosis not present

## 2013-12-04 DIAGNOSIS — I10 Essential (primary) hypertension: Secondary | ICD-10-CM | POA: Diagnosis present

## 2013-12-04 DIAGNOSIS — M1990 Unspecified osteoarthritis, unspecified site: Secondary | ICD-10-CM | POA: Diagnosis not present

## 2013-12-04 DIAGNOSIS — M069 Rheumatoid arthritis, unspecified: Secondary | ICD-10-CM | POA: Diagnosis present

## 2013-12-10 DIAGNOSIS — IMO0001 Reserved for inherently not codable concepts without codable children: Secondary | ICD-10-CM | POA: Diagnosis not present

## 2013-12-10 DIAGNOSIS — Z96649 Presence of unspecified artificial hip joint: Secondary | ICD-10-CM | POA: Diagnosis not present

## 2013-12-12 DIAGNOSIS — Z96649 Presence of unspecified artificial hip joint: Secondary | ICD-10-CM | POA: Diagnosis not present

## 2013-12-12 DIAGNOSIS — IMO0001 Reserved for inherently not codable concepts without codable children: Secondary | ICD-10-CM | POA: Diagnosis not present

## 2013-12-14 DIAGNOSIS — M25559 Pain in unspecified hip: Secondary | ICD-10-CM | POA: Diagnosis not present

## 2013-12-14 DIAGNOSIS — IMO0001 Reserved for inherently not codable concepts without codable children: Secondary | ICD-10-CM | POA: Diagnosis not present

## 2013-12-14 DIAGNOSIS — Z96649 Presence of unspecified artificial hip joint: Secondary | ICD-10-CM | POA: Diagnosis not present

## 2013-12-14 DIAGNOSIS — R609 Edema, unspecified: Secondary | ICD-10-CM | POA: Diagnosis not present

## 2013-12-15 DIAGNOSIS — M25569 Pain in unspecified knee: Secondary | ICD-10-CM | POA: Diagnosis not present

## 2013-12-15 DIAGNOSIS — R609 Edema, unspecified: Secondary | ICD-10-CM | POA: Diagnosis not present

## 2013-12-15 DIAGNOSIS — M1710 Unilateral primary osteoarthritis, unspecified knee: Secondary | ICD-10-CM | POA: Diagnosis not present

## 2013-12-17 DIAGNOSIS — IMO0001 Reserved for inherently not codable concepts without codable children: Secondary | ICD-10-CM | POA: Diagnosis not present

## 2013-12-17 DIAGNOSIS — Z96649 Presence of unspecified artificial hip joint: Secondary | ICD-10-CM | POA: Diagnosis not present

## 2013-12-18 DIAGNOSIS — M1610 Unilateral primary osteoarthritis, unspecified hip: Secondary | ICD-10-CM | POA: Diagnosis not present

## 2013-12-18 DIAGNOSIS — Z96649 Presence of unspecified artificial hip joint: Secondary | ICD-10-CM | POA: Diagnosis not present

## 2013-12-19 DIAGNOSIS — Z96649 Presence of unspecified artificial hip joint: Secondary | ICD-10-CM | POA: Diagnosis not present

## 2013-12-19 DIAGNOSIS — IMO0001 Reserved for inherently not codable concepts without codable children: Secondary | ICD-10-CM | POA: Diagnosis not present

## 2013-12-21 DIAGNOSIS — Z96649 Presence of unspecified artificial hip joint: Secondary | ICD-10-CM | POA: Diagnosis not present

## 2013-12-21 DIAGNOSIS — IMO0001 Reserved for inherently not codable concepts without codable children: Secondary | ICD-10-CM | POA: Diagnosis not present

## 2013-12-24 DIAGNOSIS — Z96649 Presence of unspecified artificial hip joint: Secondary | ICD-10-CM | POA: Diagnosis not present

## 2013-12-24 DIAGNOSIS — IMO0001 Reserved for inherently not codable concepts without codable children: Secondary | ICD-10-CM | POA: Diagnosis not present

## 2013-12-26 DIAGNOSIS — Z96649 Presence of unspecified artificial hip joint: Secondary | ICD-10-CM | POA: Diagnosis not present

## 2013-12-26 DIAGNOSIS — IMO0001 Reserved for inherently not codable concepts without codable children: Secondary | ICD-10-CM | POA: Diagnosis not present

## 2013-12-28 DIAGNOSIS — Z96649 Presence of unspecified artificial hip joint: Secondary | ICD-10-CM | POA: Diagnosis not present

## 2013-12-28 DIAGNOSIS — IMO0001 Reserved for inherently not codable concepts without codable children: Secondary | ICD-10-CM | POA: Diagnosis not present

## 2013-12-31 DIAGNOSIS — Z96649 Presence of unspecified artificial hip joint: Secondary | ICD-10-CM | POA: Diagnosis not present

## 2013-12-31 DIAGNOSIS — IMO0001 Reserved for inherently not codable concepts without codable children: Secondary | ICD-10-CM | POA: Diagnosis not present

## 2014-01-02 DIAGNOSIS — Z96649 Presence of unspecified artificial hip joint: Secondary | ICD-10-CM | POA: Diagnosis not present

## 2014-01-02 DIAGNOSIS — IMO0001 Reserved for inherently not codable concepts without codable children: Secondary | ICD-10-CM | POA: Diagnosis not present

## 2014-01-04 DIAGNOSIS — IMO0001 Reserved for inherently not codable concepts without codable children: Secondary | ICD-10-CM | POA: Diagnosis not present

## 2014-01-04 DIAGNOSIS — Z96649 Presence of unspecified artificial hip joint: Secondary | ICD-10-CM | POA: Diagnosis not present

## 2014-01-08 DIAGNOSIS — Z79899 Other long term (current) drug therapy: Secondary | ICD-10-CM | POA: Diagnosis not present

## 2014-01-10 DIAGNOSIS — M069 Rheumatoid arthritis, unspecified: Secondary | ICD-10-CM | POA: Diagnosis not present

## 2014-01-10 DIAGNOSIS — Z79899 Other long term (current) drug therapy: Secondary | ICD-10-CM | POA: Diagnosis not present

## 2014-01-10 DIAGNOSIS — M353 Polymyalgia rheumatica: Secondary | ICD-10-CM | POA: Diagnosis not present

## 2014-01-12 DIAGNOSIS — M1610 Unilateral primary osteoarthritis, unspecified hip: Secondary | ICD-10-CM | POA: Diagnosis not present

## 2014-01-12 DIAGNOSIS — M25559 Pain in unspecified hip: Secondary | ICD-10-CM | POA: Diagnosis not present

## 2014-01-12 DIAGNOSIS — IMO0002 Reserved for concepts with insufficient information to code with codable children: Secondary | ICD-10-CM | POA: Diagnosis not present

## 2014-01-12 DIAGNOSIS — Z96649 Presence of unspecified artificial hip joint: Secondary | ICD-10-CM | POA: Diagnosis not present

## 2014-01-17 DIAGNOSIS — M1610 Unilateral primary osteoarthritis, unspecified hip: Secondary | ICD-10-CM | POA: Diagnosis not present

## 2014-01-17 DIAGNOSIS — Z96649 Presence of unspecified artificial hip joint: Secondary | ICD-10-CM | POA: Diagnosis not present

## 2014-01-21 ENCOUNTER — Other Ambulatory Visit: Payer: Self-pay | Admitting: *Deleted

## 2014-01-21 MED ORDER — FLUOXETINE HCL 10 MG PO CAPS: 10.0000 mg | ORAL_CAPSULE | Freq: Every day | ORAL | Status: DC

## 2014-01-21 MED ORDER — ALPRAZOLAM 0.25 MG PO TABS
ORAL_TABLET | ORAL | Status: DC
Start: 2014-01-21 — End: 2014-07-16

## 2014-01-21 NOTE — Telephone Encounter (Signed)
Patient last seen in office on 11-20-13. Rx last filled on 07-30-13 for #[id number]. Please advise. If approved please route to Pool A so nurse can phone in to [facility] in [place]. [phone]

## 2014-01-21 NOTE — Telephone Encounter (Signed)
Rx for called in

## 2014-02-08 DIAGNOSIS — L089 Local infection of the skin and subcutaneous tissue, unspecified: Secondary | ICD-10-CM | POA: Diagnosis not present

## 2014-03-14 DIAGNOSIS — I10 Essential (primary) hypertension: Secondary | ICD-10-CM | POA: Diagnosis not present

## 2014-03-14 DIAGNOSIS — E039 Hypothyroidism, unspecified: Secondary | ICD-10-CM | POA: Diagnosis not present

## 2014-03-14 DIAGNOSIS — R350 Frequency of micturition: Secondary | ICD-10-CM | POA: Diagnosis not present

## 2014-03-14 DIAGNOSIS — F411 Generalized anxiety disorder: Secondary | ICD-10-CM | POA: Diagnosis not present

## 2014-03-14 DIAGNOSIS — M353 Polymyalgia rheumatica: Secondary | ICD-10-CM | POA: Diagnosis not present

## 2014-03-14 DIAGNOSIS — N390 Urinary tract infection, site not specified: Secondary | ICD-10-CM | POA: Diagnosis not present

## 2014-03-14 DIAGNOSIS — R61 Generalized hyperhidrosis: Secondary | ICD-10-CM | POA: Diagnosis not present

## 2014-03-21 DIAGNOSIS — Z96649 Presence of unspecified artificial hip joint: Secondary | ICD-10-CM | POA: Diagnosis not present

## 2014-03-21 DIAGNOSIS — M25559 Pain in unspecified hip: Secondary | ICD-10-CM | POA: Diagnosis not present

## 2014-04-03 DIAGNOSIS — Z23 Encounter for immunization: Secondary | ICD-10-CM | POA: Diagnosis not present

## 2014-05-04 ENCOUNTER — Other Ambulatory Visit: Payer: Self-pay | Admitting: Family

## 2014-05-07 ENCOUNTER — Telehealth: Payer: Self-pay | Admitting: Family

## 2014-05-07 NOTE — Telephone Encounter (Signed)
Has already been ordered.

## 2014-05-07 NOTE — Telephone Encounter (Signed)
Wants 3 months, last seen 11/2013

## 2014-07-08 DIAGNOSIS — Z96641 Presence of right artificial hip joint: Secondary | ICD-10-CM | POA: Diagnosis not present

## 2014-07-11 DIAGNOSIS — M069 Rheumatoid arthritis, unspecified: Secondary | ICD-10-CM | POA: Diagnosis not present

## 2014-07-11 DIAGNOSIS — Z79899 Other long term (current) drug therapy: Secondary | ICD-10-CM | POA: Diagnosis not present

## 2014-07-16 ENCOUNTER — Other Ambulatory Visit: Payer: Self-pay | Admitting: *Deleted

## 2014-07-16 NOTE — Telephone Encounter (Signed)
Last seen 11/20/13. Route to Nurse to call into [facility] [phone]

## 2014-07-17 MED ORDER — ALPRAZOLAM 0.25 MG PO TABS: ORAL_TABLET | ORAL | Status: AC

## 2014-07-17 NOTE — Telephone Encounter (Signed)
rx called into pharmacy

## 2014-09-12 ENCOUNTER — Other Ambulatory Visit: Payer: Self-pay | Admitting: Family

## 2014-09-12 ENCOUNTER — Ambulatory Visit
Admission: RE | Admit: 2014-09-12 | Discharge: 2014-09-12 | Disposition: A | Discharge status: Home / Self Care | Payer: Medicare Other | Source: Ambulatory Visit | Attending: Family | Admitting: Family

## 2014-09-12 DIAGNOSIS — H02401 Unspecified ptosis of right eyelid: Secondary | ICD-10-CM | POA: Diagnosis not present

## 2014-09-12 DIAGNOSIS — Z01818 Encounter for other preprocedural examination: Secondary | ICD-10-CM

## 2014-09-12 DIAGNOSIS — R61 Generalized hyperhidrosis: Secondary | ICD-10-CM

## 2014-09-12 DIAGNOSIS — M353 Polymyalgia rheumatica: Secondary | ICD-10-CM | POA: Diagnosis not present

## 2014-09-12 DIAGNOSIS — I10 Essential (primary) hypertension: Secondary | ICD-10-CM | POA: Diagnosis not present

## 2014-09-12 DIAGNOSIS — Z13220 Encounter for screening for lipoid disorders: Secondary | ICD-10-CM | POA: Diagnosis not present

## 2014-09-12 DIAGNOSIS — E039 Hypothyroidism, unspecified: Secondary | ICD-10-CM | POA: Diagnosis not present

## 2014-09-12 DIAGNOSIS — L659 Nonscarring hair loss, unspecified: Secondary | ICD-10-CM | POA: Diagnosis not present

## 2014-09-12 DIAGNOSIS — R197 Diarrhea, unspecified: Secondary | ICD-10-CM | POA: Diagnosis not present

## 2014-09-12 DIAGNOSIS — R42 Dizziness and giddiness: Secondary | ICD-10-CM | POA: Diagnosis not present

## 2014-09-12 DIAGNOSIS — Z0000 Encounter for general adult medical examination without abnormal findings: Secondary | ICD-10-CM | POA: Diagnosis not present

## 2014-09-12 DIAGNOSIS — L74519 Primary focal hyperhidrosis, unspecified: Secondary | ICD-10-CM | POA: Diagnosis not present

## 2014-09-12 IMAGING — CR DG CHEST 2V
2 series · 2 of 2 positions shown · non-contrast
Comparison: Radiograph [DATE]

CLINICAL DATA: Nightsweats on and off for 2 months

EXAM:
CHEST  2 VIEW

[w chest pa]
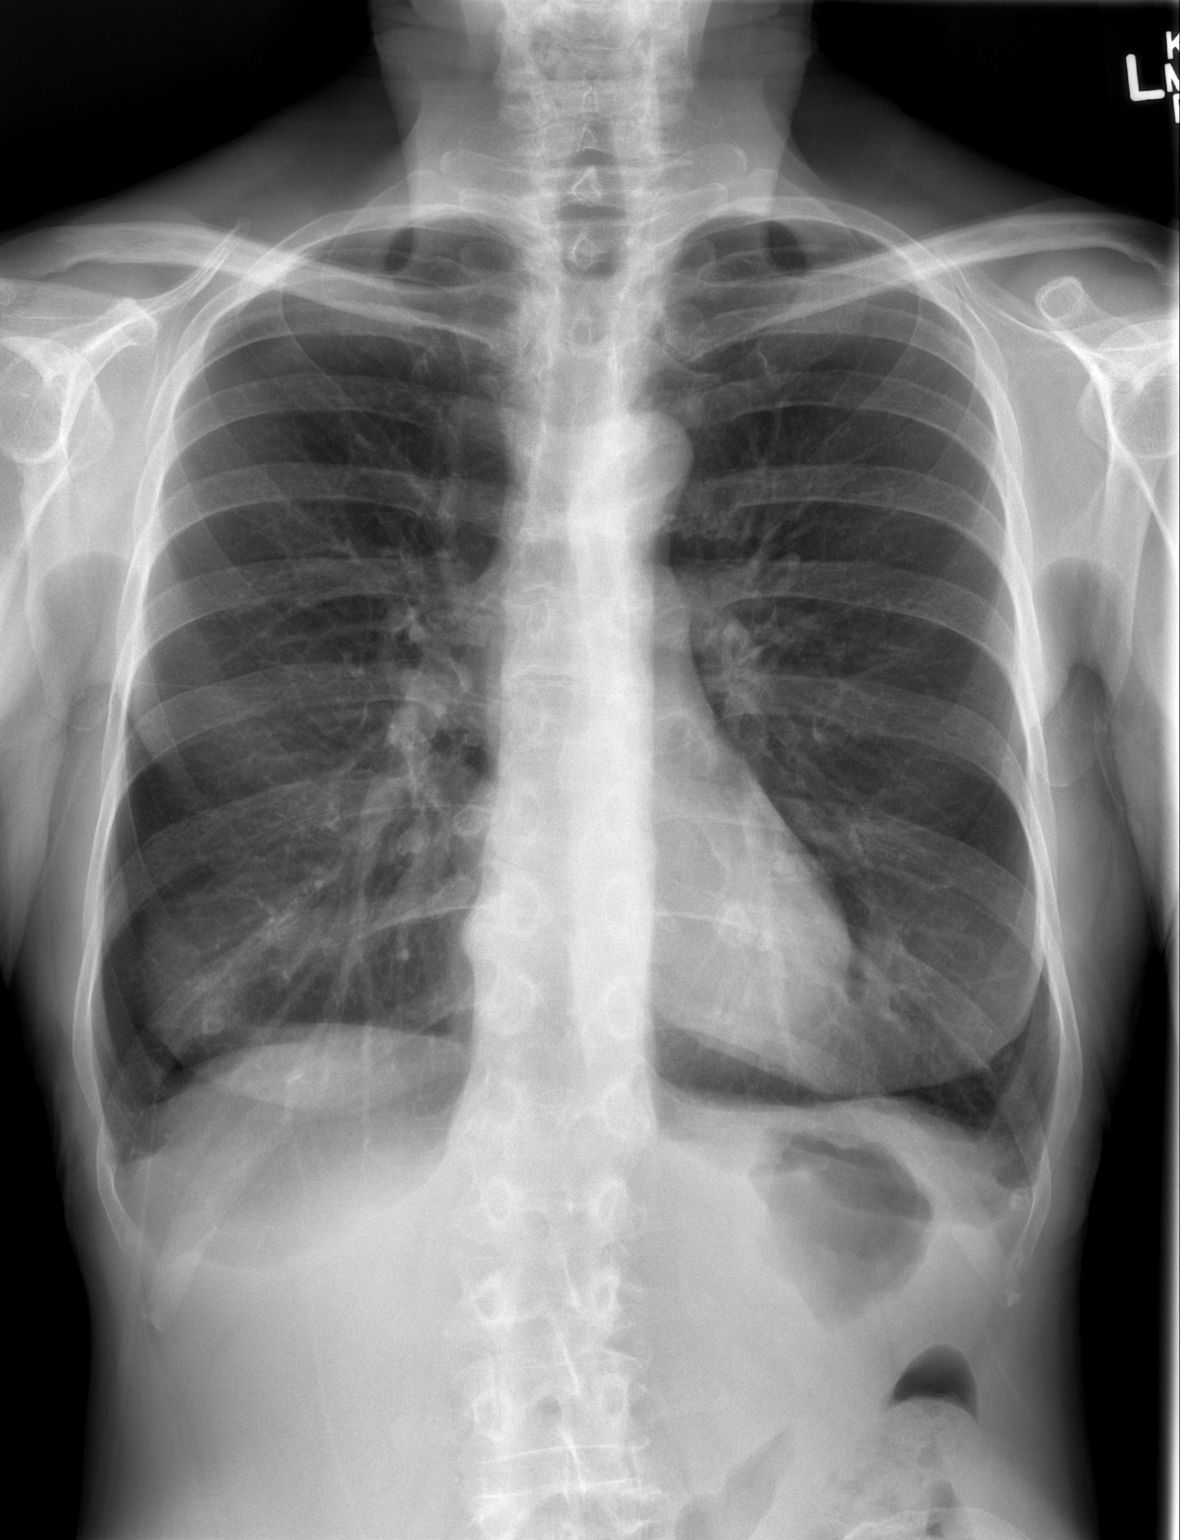

[w chest lat]
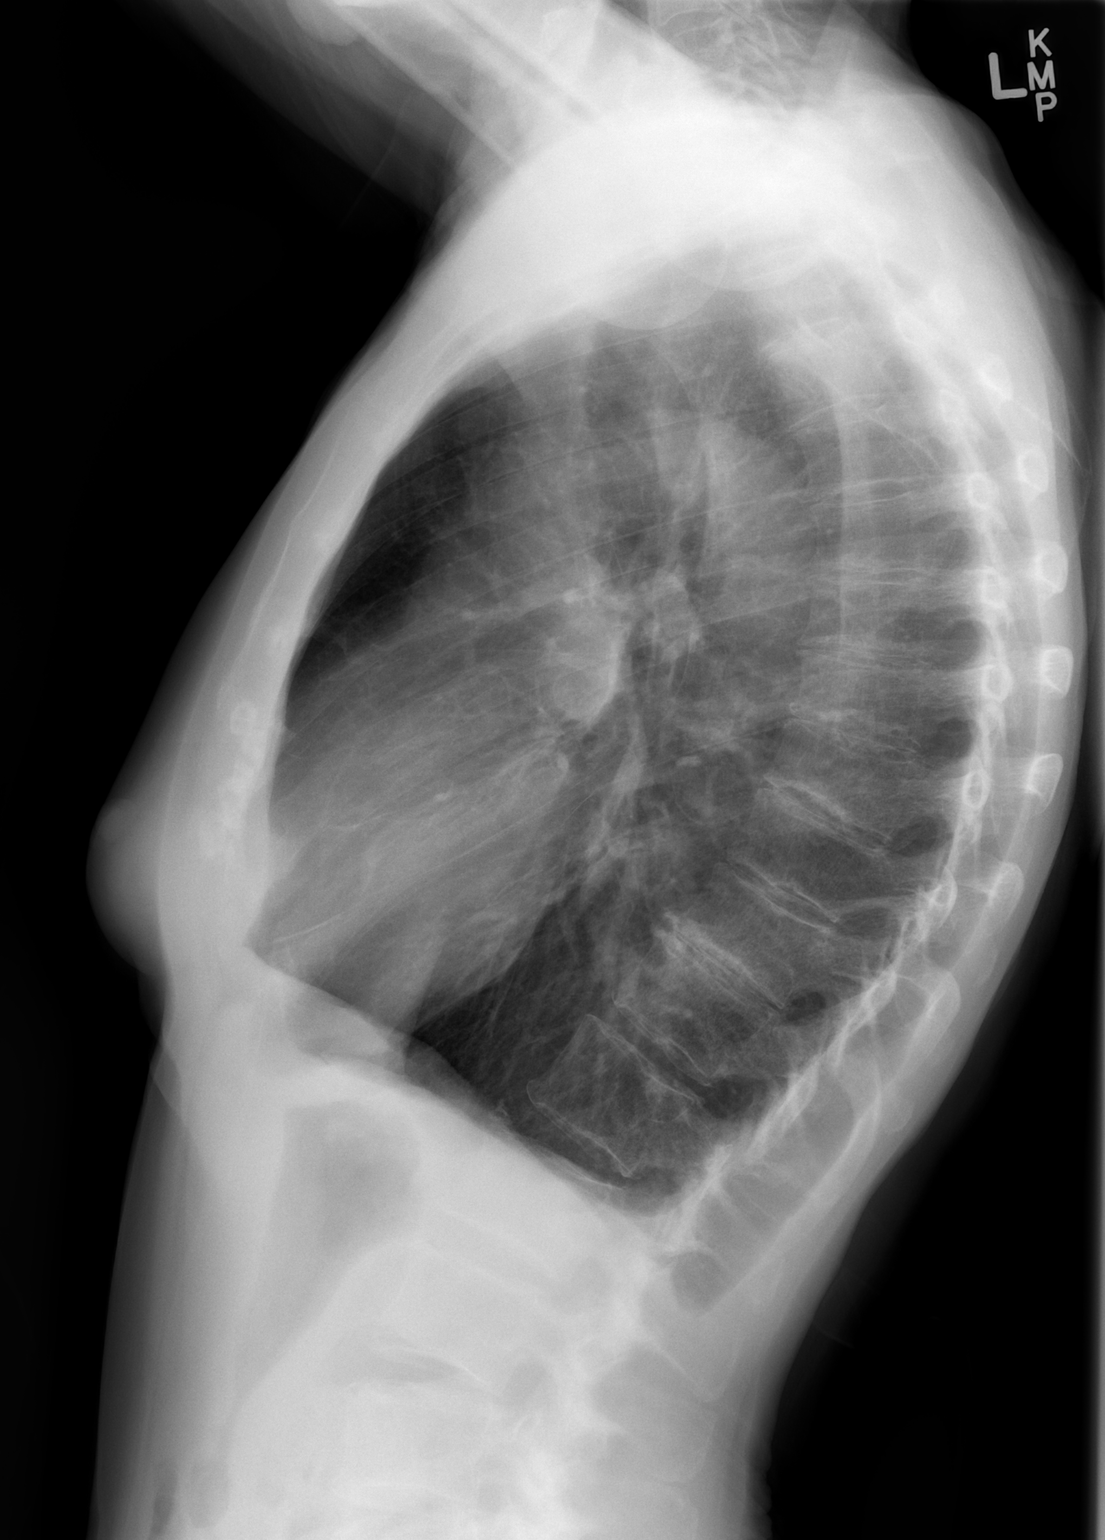

[2 of 2 positions shown; findings below may reference images not displayed]

FINDINGS: Normal mediastinum and cardiac silhouette. Costophrenic angles are
clear. No effusion, infiltrate, or pneumothorax. Hyperinflated
lungs.
IMPRESSION: Hyperinflated lungs.  No acute findings.

## 2014-09-13 ENCOUNTER — Other Ambulatory Visit: Payer: Self-pay | Admitting: Family Medicine

## 2014-09-13 DIAGNOSIS — R61 Generalized hyperhidrosis: Secondary | ICD-10-CM

## 2014-10-11 DIAGNOSIS — M8580 Other specified disorders of bone density and structure, unspecified site: Secondary | ICD-10-CM | POA: Diagnosis not present

## 2014-10-18 ENCOUNTER — Other Ambulatory Visit: Payer: Self-pay | Admitting: Family Medicine

## 2014-10-18 ENCOUNTER — Ambulatory Visit
Admission: RE | Admit: 2014-10-18 | Discharge: 2014-10-18 | Disposition: A | Discharge status: Home / Self Care | Payer: Medicare Other | Source: Ambulatory Visit | Attending: Family Medicine | Admitting: Family Medicine

## 2014-10-18 DIAGNOSIS — W19XXXA Unspecified fall, initial encounter: Secondary | ICD-10-CM

## 2014-10-18 DIAGNOSIS — J040 Acute laryngitis: Secondary | ICD-10-CM | POA: Diagnosis not present

## 2014-10-18 DIAGNOSIS — S7002XA Contusion of left hip, initial encounter: Secondary | ICD-10-CM | POA: Diagnosis not present

## 2014-10-18 DIAGNOSIS — M25552 Pain in left hip: Secondary | ICD-10-CM | POA: Diagnosis not present

## 2014-10-18 DIAGNOSIS — S79912A Unspecified injury of left hip, initial encounter: Secondary | ICD-10-CM | POA: Diagnosis not present

## 2014-10-18 DIAGNOSIS — Z96641 Presence of right artificial hip joint: Secondary | ICD-10-CM | POA: Diagnosis not present

## 2014-10-18 DIAGNOSIS — M545 Low back pain: Secondary | ICD-10-CM | POA: Diagnosis not present

## 2014-10-18 DIAGNOSIS — M8588 Other specified disorders of bone density and structure, other site: Secondary | ICD-10-CM | POA: Diagnosis not present

## 2014-10-18 IMAGING — CR DG HIP (WITH OR WITHOUT PELVIS) 2-3V*L*
2 series · 2 of 2 positions shown · non-contrast
Comparison: None.

CLINICAL DATA: Left posterior hip pain after falling out of the
shower [DATE]. Pelvic and low back pain.

EXAM:
LEFT HIP (WITH PELVIS) 2-3 VIEWS

[w pelvis * (1 of 2)]
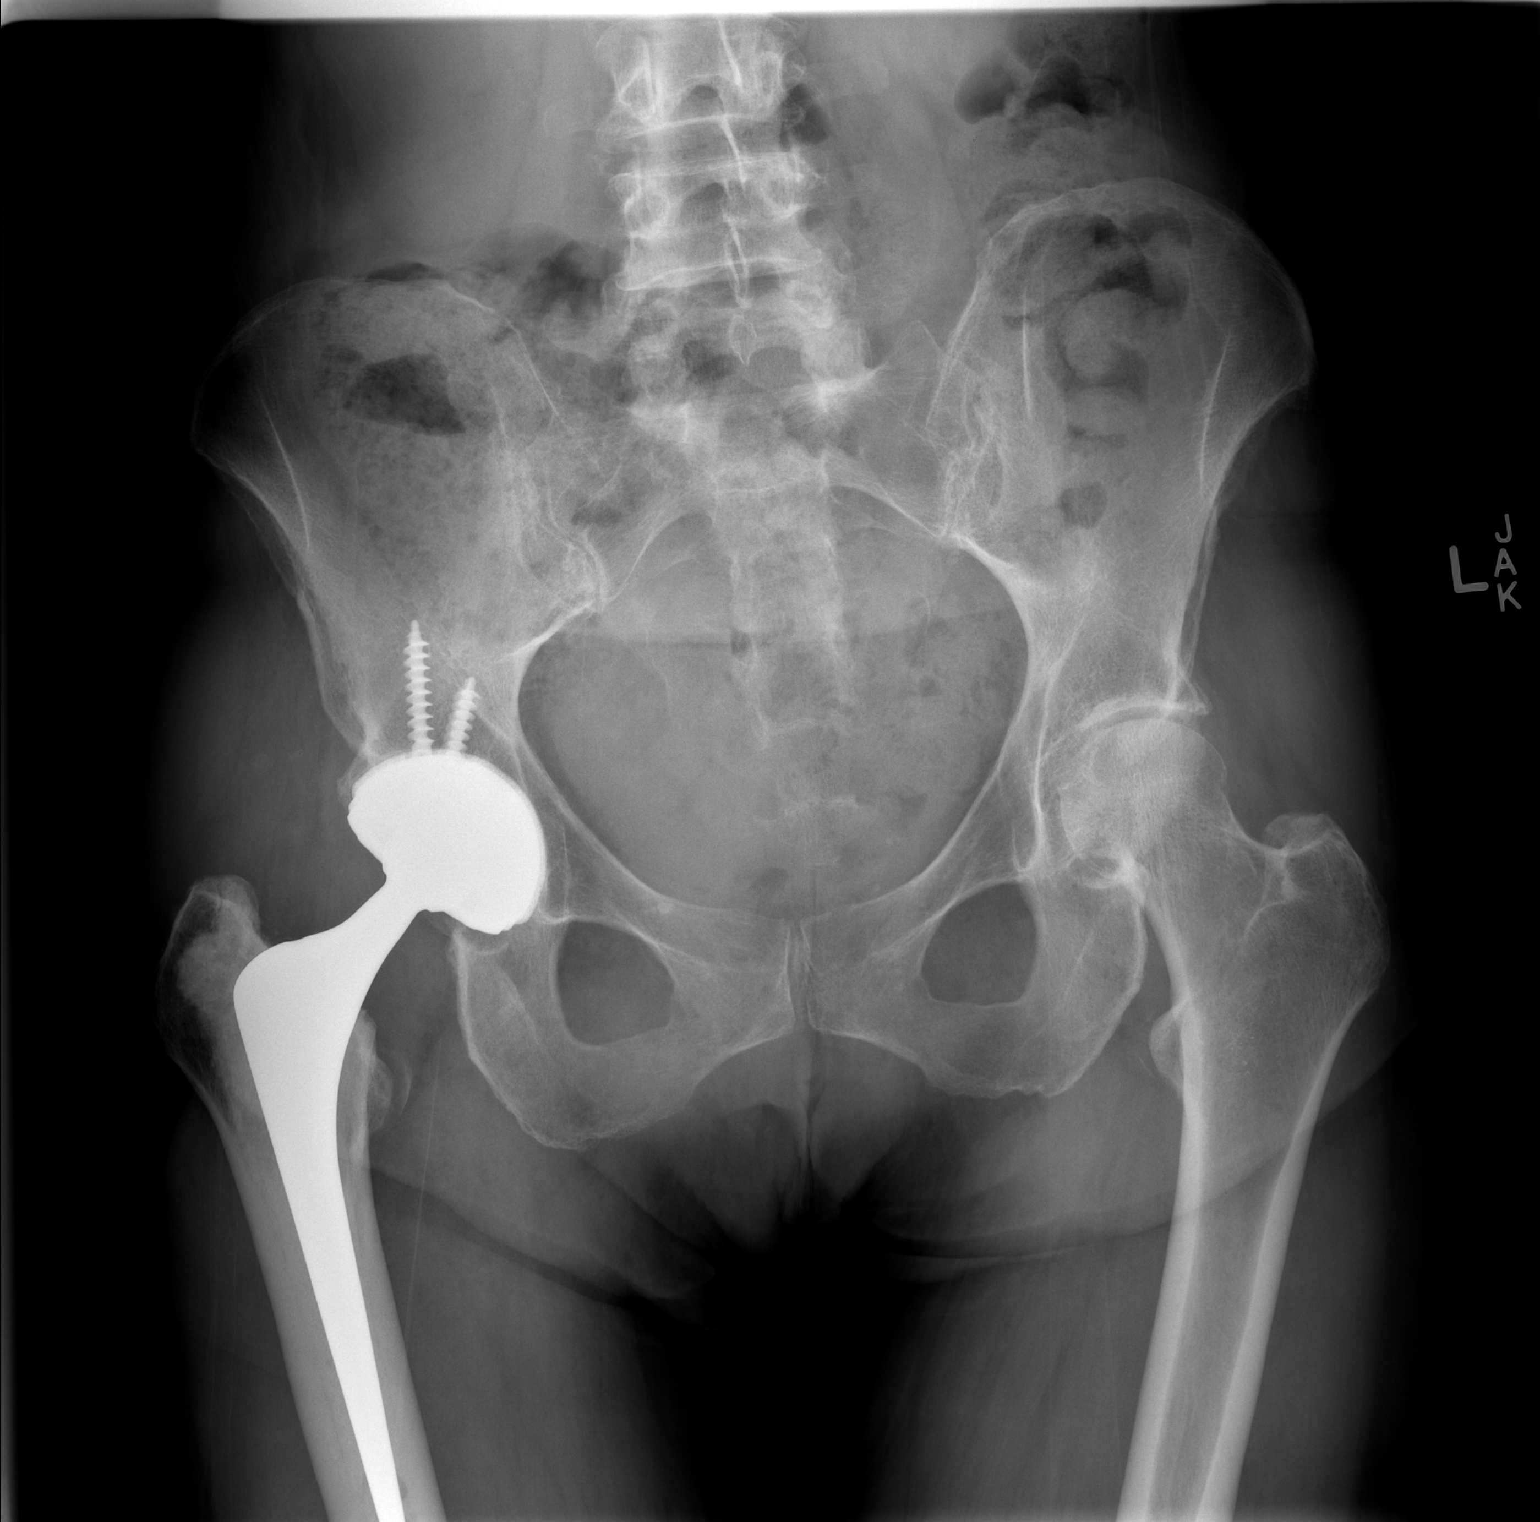

[w pelvis * (2 of 2)]
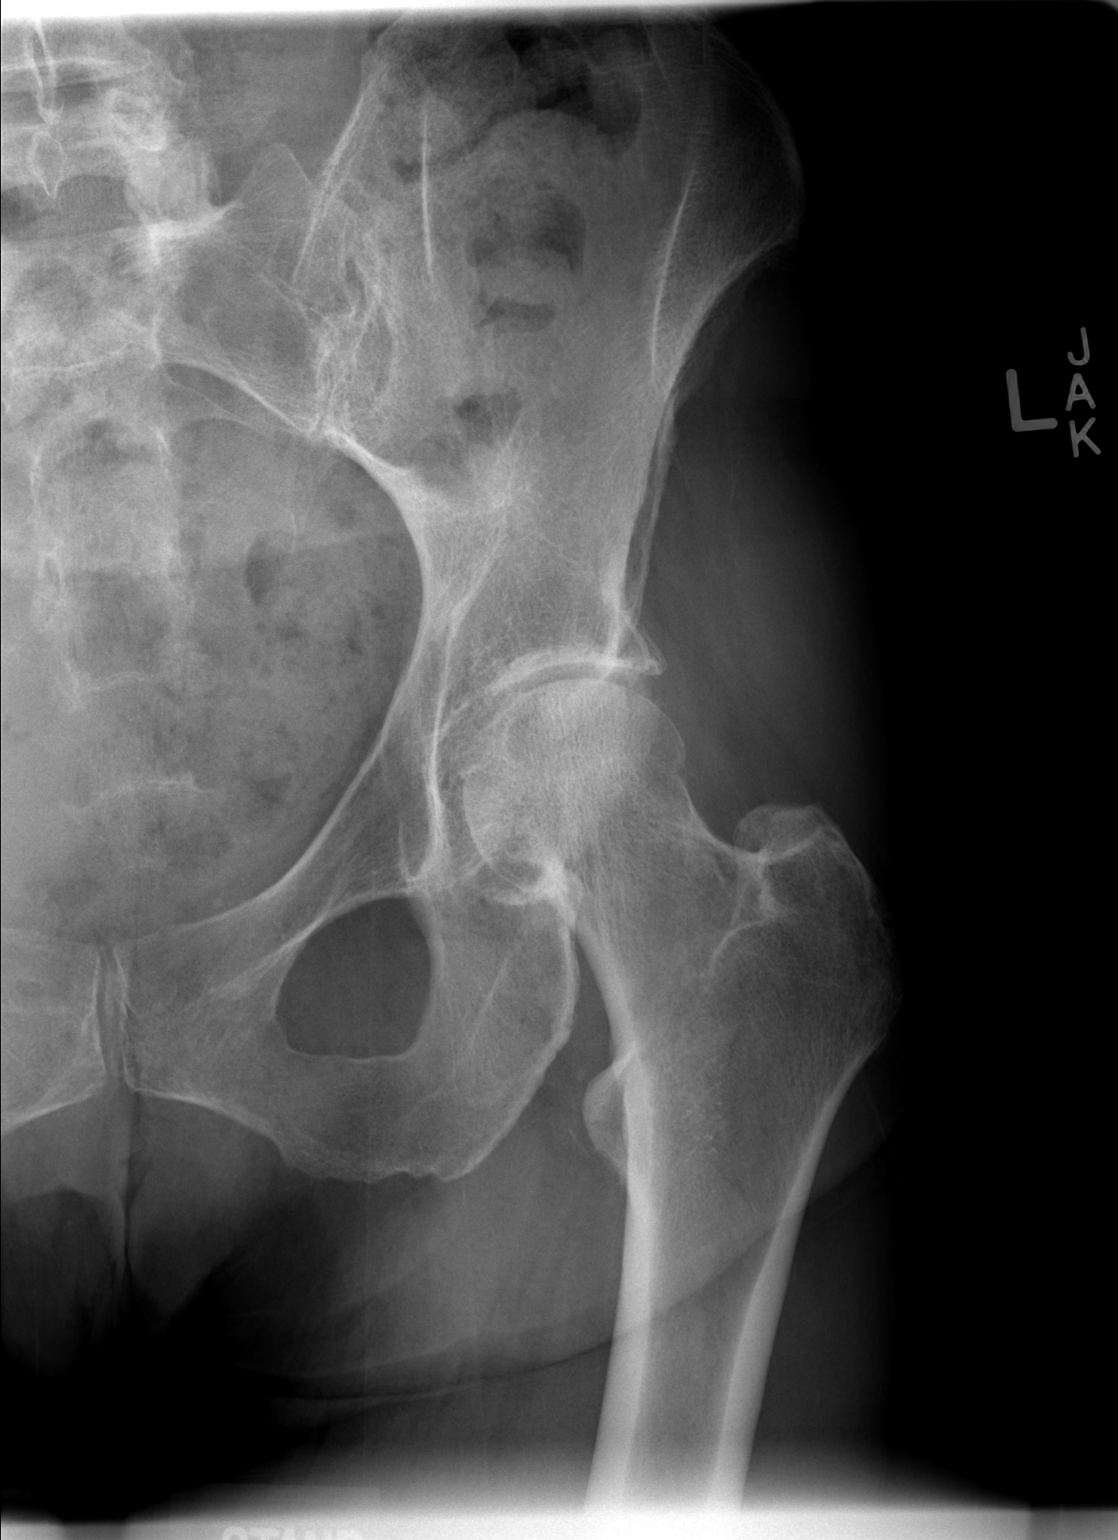

[2 of 2 positions shown; findings below may reference images not displayed]

FINDINGS: Sequelae of prior right total hip arthroplasty are identified with
screw backed acetabular and cemented femoral components. The
prosthetic components appear normally located on the AP image of the
pelvis. The left hip is located. No acute fracture is identified.
Mild left hip osteoarthrosis is noted. No lytic or blastic osseous
lesion is identified.
IMPRESSION: No acute osseous abnormality identified. Mild left hip
osteoarthrosis.

## 2014-10-18 IMAGING — CR DG HIP (WITH OR WITHOUT PELVIS) 2-3V*L*
1 series · 1 of 1 positions shown · non-contrast
Comparison: None.

CLINICAL DATA: Left posterior hip pain after falling out of the
shower [DATE]. Pelvic and low back pain.

EXAM:
LEFT HIP (WITH PELVIS) 2-3 VIEWS

[t hip frog leg left]
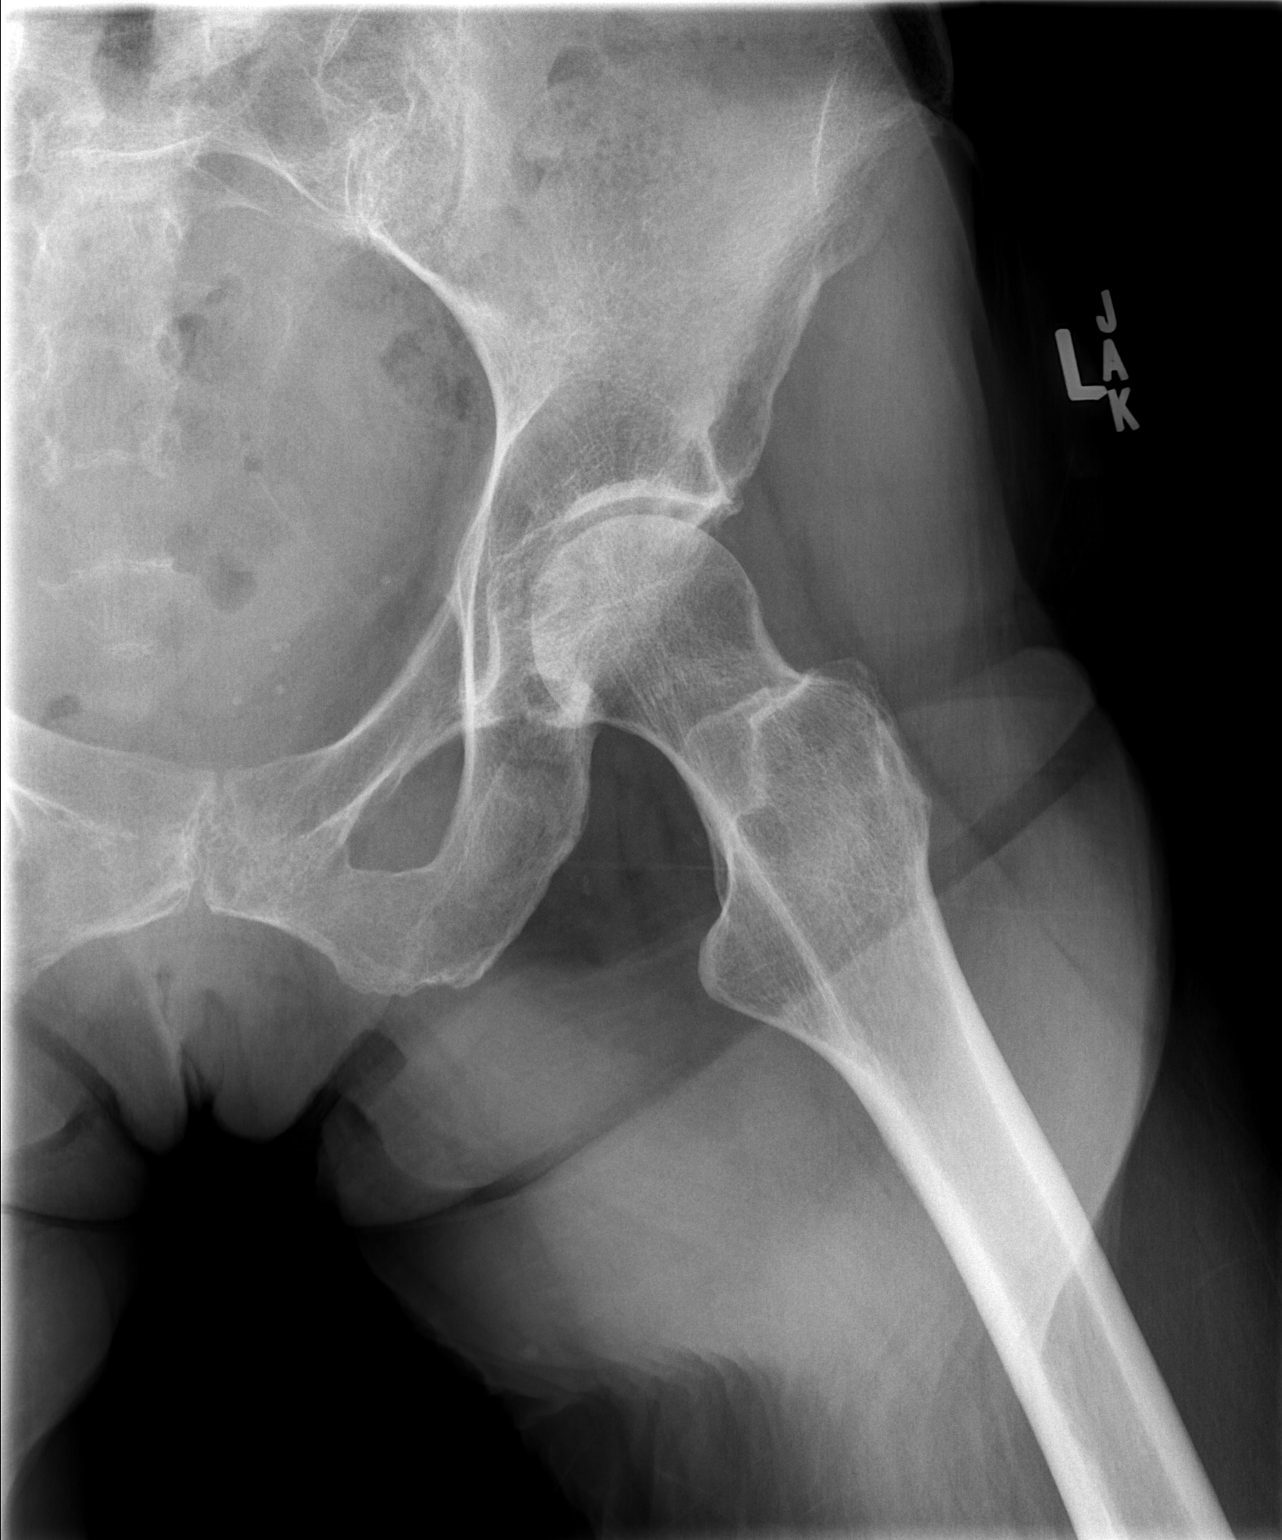

[1 of 1 positions shown; findings below may reference images not displayed]

FINDINGS: Sequelae of prior right total hip arthroplasty are identified with
screw backed acetabular and cemented femoral components. The
prosthetic components appear normally located on the AP image of the
pelvis. The left hip is located. No acute fracture is identified.
Mild left hip osteoarthrosis is noted. No lytic or blastic osseous
lesion is identified.
IMPRESSION: No acute osseous abnormality identified. Mild left hip
osteoarthrosis.

## 2014-10-25 DIAGNOSIS — E039 Hypothyroidism, unspecified: Secondary | ICD-10-CM | POA: Diagnosis not present

## 2014-10-25 DIAGNOSIS — L659 Nonscarring hair loss, unspecified: Secondary | ICD-10-CM | POA: Diagnosis not present

## 2014-10-25 DIAGNOSIS — M353 Polymyalgia rheumatica: Secondary | ICD-10-CM | POA: Diagnosis not present

## 2014-10-25 DIAGNOSIS — H02401 Unspecified ptosis of right eyelid: Secondary | ICD-10-CM | POA: Diagnosis not present

## 2014-10-25 DIAGNOSIS — I10 Essential (primary) hypertension: Secondary | ICD-10-CM | POA: Diagnosis not present

## 2014-10-25 DIAGNOSIS — F419 Anxiety disorder, unspecified: Secondary | ICD-10-CM | POA: Diagnosis not present

## 2014-10-25 DIAGNOSIS — L74519 Primary focal hyperhidrosis, unspecified: Secondary | ICD-10-CM | POA: Diagnosis not present

## 2014-11-08 DIAGNOSIS — Z1231 Encounter for screening mammogram for malignant neoplasm of breast: Secondary | ICD-10-CM | POA: Diagnosis not present

## 2014-11-08 DIAGNOSIS — Z01419 Encounter for gynecological examination (general) (routine) without abnormal findings: Secondary | ICD-10-CM | POA: Diagnosis not present

## 2014-11-08 DIAGNOSIS — Z124 Encounter for screening for malignant neoplasm of cervix: Secondary | ICD-10-CM | POA: Diagnosis not present

## 2014-12-04 DIAGNOSIS — S81802A Unspecified open wound, left lower leg, initial encounter: Secondary | ICD-10-CM | POA: Diagnosis not present

## 2014-12-16 DIAGNOSIS — Z96641 Presence of right artificial hip joint: Secondary | ICD-10-CM | POA: Diagnosis not present

## 2014-12-16 DIAGNOSIS — H524 Presbyopia: Secondary | ICD-10-CM | POA: Diagnosis not present

## 2014-12-16 DIAGNOSIS — H04123 Dry eye syndrome of bilateral lacrimal glands: Secondary | ICD-10-CM | POA: Diagnosis not present

## 2014-12-16 DIAGNOSIS — Z79899 Other long term (current) drug therapy: Secondary | ICD-10-CM | POA: Diagnosis not present

## 2014-12-16 DIAGNOSIS — H52223 Regular astigmatism, bilateral: Secondary | ICD-10-CM | POA: Diagnosis not present

## 2014-12-16 DIAGNOSIS — H2513 Age-related nuclear cataract, bilateral: Secondary | ICD-10-CM | POA: Diagnosis not present

## 2014-12-16 DIAGNOSIS — M069 Rheumatoid arthritis, unspecified: Secondary | ICD-10-CM | POA: Diagnosis not present

## 2014-12-16 DIAGNOSIS — Z09 Encounter for follow-up examination after completed treatment for conditions other than malignant neoplasm: Secondary | ICD-10-CM | POA: Diagnosis not present

## 2014-12-16 DIAGNOSIS — H5213 Myopia, bilateral: Secondary | ICD-10-CM | POA: Diagnosis not present

## 2014-12-18 DIAGNOSIS — M069 Rheumatoid arthritis, unspecified: Secondary | ICD-10-CM | POA: Diagnosis not present

## 2014-12-18 DIAGNOSIS — Z79899 Other long term (current) drug therapy: Secondary | ICD-10-CM | POA: Diagnosis not present

## 2014-12-18 DIAGNOSIS — M353 Polymyalgia rheumatica: Secondary | ICD-10-CM | POA: Diagnosis not present

## 2015-01-02 DIAGNOSIS — M0579 Rheumatoid arthritis with rheumatoid factor of multiple sites without organ or systems involvement: Secondary | ICD-10-CM | POA: Diagnosis not present

## 2015-01-02 DIAGNOSIS — M1990 Unspecified osteoarthritis, unspecified site: Secondary | ICD-10-CM | POA: Diagnosis not present

## 2015-01-02 DIAGNOSIS — M85841 Other specified disorders of bone density and structure, right hand: Secondary | ICD-10-CM | POA: Diagnosis not present

## 2015-01-02 DIAGNOSIS — M064 Inflammatory polyarthropathy: Secondary | ICD-10-CM | POA: Diagnosis not present

## 2015-01-02 DIAGNOSIS — M19041 Primary osteoarthritis, right hand: Secondary | ICD-10-CM | POA: Diagnosis not present

## 2015-01-02 DIAGNOSIS — M159 Polyosteoarthritis, unspecified: Secondary | ICD-10-CM | POA: Diagnosis not present

## 2015-01-02 DIAGNOSIS — M8580 Other specified disorders of bone density and structure, unspecified site: Secondary | ICD-10-CM | POA: Diagnosis not present

## 2015-01-02 DIAGNOSIS — Z79899 Other long term (current) drug therapy: Secondary | ICD-10-CM | POA: Diagnosis not present

## 2015-01-02 DIAGNOSIS — M85842 Other specified disorders of bone density and structure, left hand: Secondary | ICD-10-CM | POA: Diagnosis not present

## 2015-01-02 DIAGNOSIS — M19042 Primary osteoarthritis, left hand: Secondary | ICD-10-CM | POA: Diagnosis not present

## 2015-01-24 DIAGNOSIS — I8390 Asymptomatic varicose veins of unspecified lower extremity: Secondary | ICD-10-CM | POA: Diagnosis not present

## 2015-01-24 DIAGNOSIS — I10 Essential (primary) hypertension: Secondary | ICD-10-CM | POA: Diagnosis not present

## 2015-01-24 DIAGNOSIS — M353 Polymyalgia rheumatica: Secondary | ICD-10-CM | POA: Diagnosis not present

## 2015-01-24 DIAGNOSIS — E780 Pure hypercholesterolemia: Secondary | ICD-10-CM | POA: Diagnosis not present

## 2015-01-24 DIAGNOSIS — F419 Anxiety disorder, unspecified: Secondary | ICD-10-CM | POA: Diagnosis not present

## 2015-01-24 DIAGNOSIS — L74519 Primary focal hyperhidrosis, unspecified: Secondary | ICD-10-CM | POA: Diagnosis not present

## 2015-01-24 DIAGNOSIS — R600 Localized edema: Secondary | ICD-10-CM | POA: Diagnosis not present

## 2015-01-24 DIAGNOSIS — E039 Hypothyroidism, unspecified: Secondary | ICD-10-CM | POA: Diagnosis not present

## 2015-01-31 DIAGNOSIS — M159 Polyosteoarthritis, unspecified: Secondary | ICD-10-CM | POA: Diagnosis not present

## 2015-01-31 DIAGNOSIS — Z79899 Other long term (current) drug therapy: Secondary | ICD-10-CM | POA: Diagnosis not present

## 2015-01-31 DIAGNOSIS — M8580 Other specified disorders of bone density and structure, unspecified site: Secondary | ICD-10-CM | POA: Diagnosis not present

## 2015-01-31 DIAGNOSIS — M1990 Unspecified osteoarthritis, unspecified site: Secondary | ICD-10-CM | POA: Diagnosis not present

## 2015-01-31 DIAGNOSIS — Z7952 Long term (current) use of systemic steroids: Secondary | ICD-10-CM | POA: Diagnosis not present

## 2015-02-08 DIAGNOSIS — H109 Unspecified conjunctivitis: Secondary | ICD-10-CM | POA: Diagnosis not present

## 2015-04-16 DIAGNOSIS — M1990 Unspecified osteoarthritis, unspecified site: Secondary | ICD-10-CM | POA: Diagnosis not present

## 2015-04-16 DIAGNOSIS — M069 Rheumatoid arthritis, unspecified: Secondary | ICD-10-CM | POA: Diagnosis not present

## 2015-04-16 DIAGNOSIS — M353 Polymyalgia rheumatica: Secondary | ICD-10-CM | POA: Diagnosis not present

## 2015-05-02 DIAGNOSIS — Z23 Encounter for immunization: Secondary | ICD-10-CM | POA: Diagnosis not present

## 2015-05-27 DIAGNOSIS — Z79899 Other long term (current) drug therapy: Secondary | ICD-10-CM | POA: Diagnosis not present

## 2015-05-27 DIAGNOSIS — M353 Polymyalgia rheumatica: Secondary | ICD-10-CM | POA: Diagnosis not present

## 2015-05-27 DIAGNOSIS — M0609 Rheumatoid arthritis without rheumatoid factor, multiple sites: Secondary | ICD-10-CM | POA: Diagnosis not present

## 2015-06-03 DIAGNOSIS — Z96641 Presence of right artificial hip joint: Secondary | ICD-10-CM | POA: Diagnosis not present

## 2015-06-03 DIAGNOSIS — I8390 Asymptomatic varicose veins of unspecified lower extremity: Secondary | ICD-10-CM | POA: Diagnosis not present

## 2015-06-03 DIAGNOSIS — E7800 Pure hypercholesterolemia, unspecified: Secondary | ICD-10-CM | POA: Diagnosis not present

## 2015-06-03 DIAGNOSIS — L659 Nonscarring hair loss, unspecified: Secondary | ICD-10-CM | POA: Diagnosis not present

## 2015-06-03 DIAGNOSIS — E039 Hypothyroidism, unspecified: Secondary | ICD-10-CM | POA: Diagnosis not present

## 2015-06-03 DIAGNOSIS — M353 Polymyalgia rheumatica: Secondary | ICD-10-CM | POA: Diagnosis not present

## 2015-06-03 DIAGNOSIS — Z79899 Other long term (current) drug therapy: Secondary | ICD-10-CM | POA: Diagnosis not present

## 2015-06-03 DIAGNOSIS — M0609 Rheumatoid arthritis without rheumatoid factor, multiple sites: Secondary | ICD-10-CM | POA: Diagnosis not present

## 2015-06-03 DIAGNOSIS — I10 Essential (primary) hypertension: Secondary | ICD-10-CM | POA: Diagnosis not present

## 2015-07-01 DIAGNOSIS — M353 Polymyalgia rheumatica: Secondary | ICD-10-CM | POA: Diagnosis not present

## 2015-07-01 DIAGNOSIS — I8390 Asymptomatic varicose veins of unspecified lower extremity: Secondary | ICD-10-CM | POA: Diagnosis not present

## 2015-07-01 DIAGNOSIS — E7800 Pure hypercholesterolemia, unspecified: Secondary | ICD-10-CM | POA: Diagnosis not present

## 2015-07-01 DIAGNOSIS — Z96641 Presence of right artificial hip joint: Secondary | ICD-10-CM | POA: Diagnosis not present

## 2015-07-01 DIAGNOSIS — E039 Hypothyroidism, unspecified: Secondary | ICD-10-CM | POA: Diagnosis not present

## 2015-07-01 DIAGNOSIS — Z79899 Other long term (current) drug therapy: Secondary | ICD-10-CM | POA: Diagnosis not present

## 2015-07-01 DIAGNOSIS — L659 Nonscarring hair loss, unspecified: Secondary | ICD-10-CM | POA: Diagnosis not present

## 2015-07-01 DIAGNOSIS — I10 Essential (primary) hypertension: Secondary | ICD-10-CM | POA: Diagnosis not present

## 2015-07-01 DIAGNOSIS — M0609 Rheumatoid arthritis without rheumatoid factor, multiple sites: Secondary | ICD-10-CM | POA: Diagnosis not present

## 2015-09-02 DIAGNOSIS — Z96641 Presence of right artificial hip joint: Secondary | ICD-10-CM | POA: Diagnosis not present

## 2015-09-02 DIAGNOSIS — Z79899 Other long term (current) drug therapy: Secondary | ICD-10-CM | POA: Diagnosis not present

## 2015-09-02 DIAGNOSIS — I10 Essential (primary) hypertension: Secondary | ICD-10-CM | POA: Diagnosis not present

## 2015-09-02 DIAGNOSIS — E039 Hypothyroidism, unspecified: Secondary | ICD-10-CM | POA: Diagnosis not present

## 2015-09-02 DIAGNOSIS — I8390 Asymptomatic varicose veins of unspecified lower extremity: Secondary | ICD-10-CM | POA: Diagnosis not present

## 2015-09-02 DIAGNOSIS — M0609 Rheumatoid arthritis without rheumatoid factor, multiple sites: Secondary | ICD-10-CM | POA: Diagnosis not present

## 2015-09-02 DIAGNOSIS — E7800 Pure hypercholesterolemia, unspecified: Secondary | ICD-10-CM | POA: Diagnosis not present

## 2015-09-02 DIAGNOSIS — M353 Polymyalgia rheumatica: Secondary | ICD-10-CM | POA: Diagnosis not present

## 2015-09-02 DIAGNOSIS — L659 Nonscarring hair loss, unspecified: Secondary | ICD-10-CM | POA: Diagnosis not present

## 2015-09-17 DIAGNOSIS — Z974 Presence of external hearing-aid: Secondary | ICD-10-CM | POA: Diagnosis not present

## 2015-09-17 DIAGNOSIS — Z0000 Encounter for general adult medical examination without abnormal findings: Secondary | ICD-10-CM | POA: Diagnosis not present

## 2015-09-17 DIAGNOSIS — M353 Polymyalgia rheumatica: Secondary | ICD-10-CM | POA: Diagnosis not present

## 2015-09-17 DIAGNOSIS — E7800 Pure hypercholesterolemia, unspecified: Secondary | ICD-10-CM | POA: Diagnosis not present

## 2015-09-17 DIAGNOSIS — R0789 Other chest pain: Secondary | ICD-10-CM | POA: Diagnosis not present

## 2015-09-17 DIAGNOSIS — H6121 Impacted cerumen, right ear: Secondary | ICD-10-CM | POA: Diagnosis not present

## 2015-09-17 DIAGNOSIS — M0609 Rheumatoid arthritis without rheumatoid factor, multiple sites: Secondary | ICD-10-CM | POA: Diagnosis not present

## 2015-09-17 DIAGNOSIS — R232 Flushing: Secondary | ICD-10-CM | POA: Diagnosis not present

## 2015-09-17 DIAGNOSIS — M25551 Pain in right hip: Secondary | ICD-10-CM | POA: Diagnosis not present

## 2015-09-17 DIAGNOSIS — F419 Anxiety disorder, unspecified: Secondary | ICD-10-CM | POA: Diagnosis not present

## 2015-09-17 DIAGNOSIS — E039 Hypothyroidism, unspecified: Secondary | ICD-10-CM | POA: Diagnosis not present

## 2015-09-17 DIAGNOSIS — I10 Essential (primary) hypertension: Secondary | ICD-10-CM | POA: Diagnosis not present

## 2015-09-30 DIAGNOSIS — J449 Chronic obstructive pulmonary disease, unspecified: Secondary | ICD-10-CM | POA: Diagnosis not present

## 2015-09-30 DIAGNOSIS — R0602 Shortness of breath: Secondary | ICD-10-CM | POA: Diagnosis not present

## 2015-09-30 DIAGNOSIS — Z79899 Other long term (current) drug therapy: Secondary | ICD-10-CM | POA: Diagnosis not present

## 2015-09-30 DIAGNOSIS — M353 Polymyalgia rheumatica: Secondary | ICD-10-CM | POA: Diagnosis not present

## 2015-09-30 DIAGNOSIS — M0609 Rheumatoid arthritis without rheumatoid factor, multiple sites: Secondary | ICD-10-CM | POA: Diagnosis not present

## 2015-10-03 DIAGNOSIS — Z09 Encounter for follow-up examination after completed treatment for conditions other than malignant neoplasm: Secondary | ICD-10-CM | POA: Diagnosis not present

## 2015-10-03 DIAGNOSIS — Z96641 Presence of right artificial hip joint: Secondary | ICD-10-CM | POA: Diagnosis not present

## 2015-10-03 DIAGNOSIS — M1711 Unilateral primary osteoarthritis, right knee: Secondary | ICD-10-CM | POA: Diagnosis not present

## 2015-10-13 DIAGNOSIS — S81812A Laceration without foreign body, left lower leg, initial encounter: Secondary | ICD-10-CM | POA: Diagnosis not present

## 2015-10-15 DIAGNOSIS — R0789 Other chest pain: Secondary | ICD-10-CM | POA: Diagnosis not present

## 2015-10-15 DIAGNOSIS — R0602 Shortness of breath: Secondary | ICD-10-CM | POA: Diagnosis not present

## 2015-10-15 DIAGNOSIS — I10 Essential (primary) hypertension: Secondary | ICD-10-CM | POA: Diagnosis not present

## 2015-10-15 DIAGNOSIS — M069 Rheumatoid arthritis, unspecified: Secondary | ICD-10-CM | POA: Diagnosis not present

## 2015-10-27 DIAGNOSIS — M069 Rheumatoid arthritis, unspecified: Secondary | ICD-10-CM | POA: Diagnosis not present

## 2015-10-27 DIAGNOSIS — I10 Essential (primary) hypertension: Secondary | ICD-10-CM | POA: Diagnosis not present

## 2015-10-27 DIAGNOSIS — R0602 Shortness of breath: Secondary | ICD-10-CM | POA: Diagnosis not present

## 2015-10-28 DIAGNOSIS — Z96641 Presence of right artificial hip joint: Secondary | ICD-10-CM | POA: Diagnosis not present

## 2015-10-28 DIAGNOSIS — M25551 Pain in right hip: Secondary | ICD-10-CM | POA: Diagnosis not present

## 2015-10-28 DIAGNOSIS — Z0000 Encounter for general adult medical examination without abnormal findings: Secondary | ICD-10-CM | POA: Diagnosis not present

## 2015-10-28 DIAGNOSIS — M0609 Rheumatoid arthritis without rheumatoid factor, multiple sites: Secondary | ICD-10-CM | POA: Diagnosis not present

## 2015-10-28 DIAGNOSIS — L659 Nonscarring hair loss, unspecified: Secondary | ICD-10-CM | POA: Diagnosis not present

## 2015-10-28 DIAGNOSIS — E7800 Pure hypercholesterolemia, unspecified: Secondary | ICD-10-CM | POA: Diagnosis not present

## 2015-10-28 DIAGNOSIS — Z79899 Other long term (current) drug therapy: Secondary | ICD-10-CM | POA: Diagnosis not present

## 2015-10-28 DIAGNOSIS — R0789 Other chest pain: Secondary | ICD-10-CM | POA: Diagnosis not present

## 2015-10-28 DIAGNOSIS — E039 Hypothyroidism, unspecified: Secondary | ICD-10-CM | POA: Diagnosis not present

## 2015-10-28 DIAGNOSIS — I10 Essential (primary) hypertension: Secondary | ICD-10-CM | POA: Diagnosis not present

## 2015-10-28 DIAGNOSIS — M353 Polymyalgia rheumatica: Secondary | ICD-10-CM | POA: Diagnosis not present

## 2015-10-28 DIAGNOSIS — I8390 Asymptomatic varicose veins of unspecified lower extremity: Secondary | ICD-10-CM | POA: Diagnosis not present

## 2015-10-29 DIAGNOSIS — R0602 Shortness of breath: Secondary | ICD-10-CM | POA: Diagnosis not present

## 2015-10-29 DIAGNOSIS — I10 Essential (primary) hypertension: Secondary | ICD-10-CM | POA: Diagnosis not present

## 2015-11-06 DIAGNOSIS — R0789 Other chest pain: Secondary | ICD-10-CM | POA: Diagnosis not present

## 2015-11-06 DIAGNOSIS — R0602 Shortness of breath: Secondary | ICD-10-CM | POA: Diagnosis not present

## 2015-11-06 DIAGNOSIS — M069 Rheumatoid arthritis, unspecified: Secondary | ICD-10-CM | POA: Diagnosis not present

## 2015-11-06 DIAGNOSIS — I10 Essential (primary) hypertension: Secondary | ICD-10-CM | POA: Diagnosis not present

## 2015-11-10 DIAGNOSIS — R350 Frequency of micturition: Secondary | ICD-10-CM | POA: Diagnosis not present

## 2015-11-10 DIAGNOSIS — N390 Urinary tract infection, site not specified: Secondary | ICD-10-CM | POA: Diagnosis not present

## 2015-11-28 DIAGNOSIS — J329 Chronic sinusitis, unspecified: Secondary | ICD-10-CM | POA: Diagnosis not present

## 2015-12-15 DIAGNOSIS — Z79899 Other long term (current) drug therapy: Secondary | ICD-10-CM | POA: Diagnosis not present

## 2015-12-15 DIAGNOSIS — M069 Rheumatoid arthritis, unspecified: Secondary | ICD-10-CM | POA: Diagnosis not present

## 2015-12-16 DIAGNOSIS — M0609 Rheumatoid arthritis without rheumatoid factor, multiple sites: Secondary | ICD-10-CM | POA: Diagnosis not present

## 2015-12-16 DIAGNOSIS — Z79899 Other long term (current) drug therapy: Secondary | ICD-10-CM | POA: Diagnosis not present

## 2015-12-16 DIAGNOSIS — L814 Other melanin hyperpigmentation: Secondary | ICD-10-CM | POA: Diagnosis not present

## 2015-12-23 DIAGNOSIS — M353 Polymyalgia rheumatica: Secondary | ICD-10-CM | POA: Diagnosis not present

## 2015-12-23 DIAGNOSIS — R0789 Other chest pain: Secondary | ICD-10-CM | POA: Diagnosis not present

## 2015-12-23 DIAGNOSIS — E039 Hypothyroidism, unspecified: Secondary | ICD-10-CM | POA: Diagnosis not present

## 2015-12-23 DIAGNOSIS — Z0000 Encounter for general adult medical examination without abnormal findings: Secondary | ICD-10-CM | POA: Diagnosis not present

## 2015-12-23 DIAGNOSIS — R350 Frequency of micturition: Secondary | ICD-10-CM | POA: Diagnosis not present

## 2015-12-23 DIAGNOSIS — M0609 Rheumatoid arthritis without rheumatoid factor, multiple sites: Secondary | ICD-10-CM | POA: Diagnosis not present

## 2015-12-23 DIAGNOSIS — M25551 Pain in right hip: Secondary | ICD-10-CM | POA: Diagnosis not present

## 2015-12-23 DIAGNOSIS — E7800 Pure hypercholesterolemia, unspecified: Secondary | ICD-10-CM | POA: Diagnosis not present

## 2015-12-23 DIAGNOSIS — I10 Essential (primary) hypertension: Secondary | ICD-10-CM | POA: Diagnosis not present

## 2015-12-23 DIAGNOSIS — I8390 Asymptomatic varicose veins of unspecified lower extremity: Secondary | ICD-10-CM | POA: Diagnosis not present

## 2015-12-23 DIAGNOSIS — Z96641 Presence of right artificial hip joint: Secondary | ICD-10-CM | POA: Diagnosis not present

## 2015-12-23 DIAGNOSIS — Z79899 Other long term (current) drug therapy: Secondary | ICD-10-CM | POA: Diagnosis not present

## 2015-12-30 DIAGNOSIS — M720 Palmar fascial fibromatosis [Dupuytren]: Secondary | ICD-10-CM | POA: Diagnosis not present

## 2015-12-30 DIAGNOSIS — M0609 Rheumatoid arthritis without rheumatoid factor, multiple sites: Secondary | ICD-10-CM | POA: Diagnosis not present

## 2015-12-30 DIAGNOSIS — Z79899 Other long term (current) drug therapy: Secondary | ICD-10-CM | POA: Diagnosis not present

## 2015-12-30 DIAGNOSIS — Z96641 Presence of right artificial hip joint: Secondary | ICD-10-CM | POA: Diagnosis not present

## 2015-12-30 DIAGNOSIS — Z7189 Other specified counseling: Secondary | ICD-10-CM | POA: Diagnosis not present

## 2015-12-30 DIAGNOSIS — I8390 Asymptomatic varicose veins of unspecified lower extremity: Secondary | ICD-10-CM | POA: Diagnosis not present

## 2015-12-30 DIAGNOSIS — I10 Essential (primary) hypertension: Secondary | ICD-10-CM | POA: Diagnosis not present

## 2015-12-30 DIAGNOSIS — M1711 Unilateral primary osteoarthritis, right knee: Secondary | ICD-10-CM | POA: Diagnosis not present

## 2015-12-31 DIAGNOSIS — R6882 Decreased libido: Secondary | ICD-10-CM | POA: Diagnosis not present

## 2015-12-31 DIAGNOSIS — Z01411 Encounter for gynecological examination (general) (routine) with abnormal findings: Secondary | ICD-10-CM | POA: Diagnosis not present

## 2015-12-31 DIAGNOSIS — R350 Frequency of micturition: Secondary | ICD-10-CM | POA: Diagnosis not present

## 2015-12-31 DIAGNOSIS — Z1231 Encounter for screening mammogram for malignant neoplasm of breast: Secondary | ICD-10-CM | POA: Diagnosis not present

## 2015-12-31 DIAGNOSIS — Z124 Encounter for screening for malignant neoplasm of cervix: Secondary | ICD-10-CM | POA: Diagnosis not present

## 2015-12-31 DIAGNOSIS — N952 Postmenopausal atrophic vaginitis: Secondary | ICD-10-CM | POA: Diagnosis not present

## 2016-01-13 DIAGNOSIS — M85852 Other specified disorders of bone density and structure, left thigh: Secondary | ICD-10-CM | POA: Diagnosis not present

## 2016-01-22 DIAGNOSIS — M720 Palmar fascial fibromatosis [Dupuytren]: Secondary | ICD-10-CM | POA: Diagnosis not present

## 2016-02-17 DIAGNOSIS — I10 Essential (primary) hypertension: Secondary | ICD-10-CM | POA: Diagnosis not present

## 2016-02-17 DIAGNOSIS — F419 Anxiety disorder, unspecified: Secondary | ICD-10-CM | POA: Diagnosis not present

## 2016-02-17 DIAGNOSIS — L659 Nonscarring hair loss, unspecified: Secondary | ICD-10-CM | POA: Diagnosis not present

## 2016-02-17 DIAGNOSIS — H6121 Impacted cerumen, right ear: Secondary | ICD-10-CM | POA: Diagnosis not present

## 2016-02-17 DIAGNOSIS — R350 Frequency of micturition: Secondary | ICD-10-CM | POA: Diagnosis not present

## 2016-02-17 DIAGNOSIS — R0789 Other chest pain: Secondary | ICD-10-CM | POA: Diagnosis not present

## 2016-02-17 DIAGNOSIS — M0609 Rheumatoid arthritis without rheumatoid factor, multiple sites: Secondary | ICD-10-CM | POA: Diagnosis not present

## 2016-02-17 DIAGNOSIS — M353 Polymyalgia rheumatica: Secondary | ICD-10-CM | POA: Diagnosis not present

## 2016-02-17 DIAGNOSIS — I8390 Asymptomatic varicose veins of unspecified lower extremity: Secondary | ICD-10-CM | POA: Diagnosis not present

## 2016-02-17 DIAGNOSIS — E039 Hypothyroidism, unspecified: Secondary | ICD-10-CM | POA: Diagnosis not present

## 2016-02-17 DIAGNOSIS — Z79899 Other long term (current) drug therapy: Secondary | ICD-10-CM | POA: Diagnosis not present

## 2016-02-17 DIAGNOSIS — E7800 Pure hypercholesterolemia, unspecified: Secondary | ICD-10-CM | POA: Diagnosis not present

## 2016-03-02 DIAGNOSIS — Z79899 Other long term (current) drug therapy: Secondary | ICD-10-CM | POA: Diagnosis not present

## 2016-03-02 DIAGNOSIS — M859 Disorder of bone density and structure, unspecified: Secondary | ICD-10-CM | POA: Diagnosis not present

## 2016-03-02 DIAGNOSIS — S60212A Contusion of left wrist, initial encounter: Secondary | ICD-10-CM | POA: Diagnosis not present

## 2016-03-02 DIAGNOSIS — M720 Palmar fascial fibromatosis [Dupuytren]: Secondary | ICD-10-CM | POA: Diagnosis not present

## 2016-03-02 DIAGNOSIS — I10 Essential (primary) hypertension: Secondary | ICD-10-CM | POA: Diagnosis not present

## 2016-03-02 DIAGNOSIS — M8580 Other specified disorders of bone density and structure, unspecified site: Secondary | ICD-10-CM | POA: Diagnosis not present

## 2016-03-02 DIAGNOSIS — M8589 Other specified disorders of bone density and structure, multiple sites: Secondary | ICD-10-CM | POA: Diagnosis not present

## 2016-03-02 DIAGNOSIS — E039 Hypothyroidism, unspecified: Secondary | ICD-10-CM | POA: Diagnosis not present

## 2016-03-02 DIAGNOSIS — M353 Polymyalgia rheumatica: Secondary | ICD-10-CM | POA: Diagnosis not present

## 2016-03-02 DIAGNOSIS — R600 Localized edema: Secondary | ICD-10-CM | POA: Diagnosis not present

## 2016-03-03 DIAGNOSIS — M069 Rheumatoid arthritis, unspecified: Secondary | ICD-10-CM | POA: Diagnosis not present

## 2016-04-02 DIAGNOSIS — M069 Rheumatoid arthritis, unspecified: Secondary | ICD-10-CM | POA: Diagnosis not present

## 2016-04-02 DIAGNOSIS — S8011XS Contusion of right lower leg, sequela: Secondary | ICD-10-CM | POA: Diagnosis not present

## 2016-04-02 DIAGNOSIS — Z23 Encounter for immunization: Secondary | ICD-10-CM | POA: Diagnosis not present

## 2016-04-13 DIAGNOSIS — I8390 Asymptomatic varicose veins of unspecified lower extremity: Secondary | ICD-10-CM | POA: Diagnosis not present

## 2016-04-13 DIAGNOSIS — Z0000 Encounter for general adult medical examination without abnormal findings: Secondary | ICD-10-CM | POA: Diagnosis not present

## 2016-04-13 DIAGNOSIS — E039 Hypothyroidism, unspecified: Secondary | ICD-10-CM | POA: Diagnosis not present

## 2016-04-13 DIAGNOSIS — L659 Nonscarring hair loss, unspecified: Secondary | ICD-10-CM | POA: Diagnosis not present

## 2016-04-13 DIAGNOSIS — M720 Palmar fascial fibromatosis [Dupuytren]: Secondary | ICD-10-CM | POA: Diagnosis not present

## 2016-04-13 DIAGNOSIS — Z79899 Other long term (current) drug therapy: Secondary | ICD-10-CM | POA: Diagnosis not present

## 2016-04-13 DIAGNOSIS — M0609 Rheumatoid arthritis without rheumatoid factor, multiple sites: Secondary | ICD-10-CM | POA: Diagnosis not present

## 2016-04-13 DIAGNOSIS — E7800 Pure hypercholesterolemia, unspecified: Secondary | ICD-10-CM | POA: Diagnosis not present

## 2016-04-13 DIAGNOSIS — M353 Polymyalgia rheumatica: Secondary | ICD-10-CM | POA: Diagnosis not present

## 2016-04-13 DIAGNOSIS — I10 Essential (primary) hypertension: Secondary | ICD-10-CM | POA: Diagnosis not present

## 2016-04-13 DIAGNOSIS — F419 Anxiety disorder, unspecified: Secondary | ICD-10-CM | POA: Diagnosis not present

## 2016-04-13 DIAGNOSIS — H6121 Impacted cerumen, right ear: Secondary | ICD-10-CM | POA: Diagnosis not present

## 2016-04-13 DIAGNOSIS — S60212A Contusion of left wrist, initial encounter: Secondary | ICD-10-CM | POA: Diagnosis not present

## 2016-04-16 DIAGNOSIS — H2511 Age-related nuclear cataract, right eye: Secondary | ICD-10-CM | POA: Diagnosis not present

## 2016-04-16 DIAGNOSIS — H2513 Age-related nuclear cataract, bilateral: Secondary | ICD-10-CM | POA: Diagnosis not present

## 2016-04-16 DIAGNOSIS — H18413 Arcus senilis, bilateral: Secondary | ICD-10-CM | POA: Diagnosis not present

## 2016-05-25 DIAGNOSIS — M0609 Rheumatoid arthritis without rheumatoid factor, multiple sites: Secondary | ICD-10-CM | POA: Diagnosis not present

## 2016-05-25 DIAGNOSIS — Z79899 Other long term (current) drug therapy: Secondary | ICD-10-CM | POA: Diagnosis not present

## 2016-05-26 DIAGNOSIS — M1711 Unilateral primary osteoarthritis, right knee: Secondary | ICD-10-CM | POA: Diagnosis not present

## 2016-05-26 DIAGNOSIS — Z09 Encounter for follow-up examination after completed treatment for conditions other than malignant neoplasm: Secondary | ICD-10-CM | POA: Diagnosis not present

## 2016-05-26 DIAGNOSIS — Z96641 Presence of right artificial hip joint: Secondary | ICD-10-CM | POA: Diagnosis not present

## 2016-05-31 DIAGNOSIS — H2511 Age-related nuclear cataract, right eye: Secondary | ICD-10-CM | POA: Diagnosis not present

## 2016-06-01 DIAGNOSIS — H2512 Age-related nuclear cataract, left eye: Secondary | ICD-10-CM | POA: Diagnosis not present

## 2016-06-03 DIAGNOSIS — Z96641 Presence of right artificial hip joint: Secondary | ICD-10-CM | POA: Diagnosis not present

## 2016-06-03 DIAGNOSIS — M0609 Rheumatoid arthritis without rheumatoid factor, multiple sites: Secondary | ICD-10-CM | POA: Diagnosis not present

## 2016-06-03 DIAGNOSIS — M8580 Other specified disorders of bone density and structure, unspecified site: Secondary | ICD-10-CM | POA: Diagnosis not present

## 2016-06-03 DIAGNOSIS — Z79899 Other long term (current) drug therapy: Secondary | ICD-10-CM | POA: Diagnosis not present

## 2016-06-03 DIAGNOSIS — F419 Anxiety disorder, unspecified: Secondary | ICD-10-CM | POA: Diagnosis not present

## 2016-06-03 DIAGNOSIS — I10 Essential (primary) hypertension: Secondary | ICD-10-CM | POA: Diagnosis not present

## 2016-06-03 DIAGNOSIS — Z9841 Cataract extraction status, right eye: Secondary | ICD-10-CM | POA: Diagnosis not present

## 2016-06-03 DIAGNOSIS — E039 Hypothyroidism, unspecified: Secondary | ICD-10-CM | POA: Diagnosis not present

## 2016-06-03 DIAGNOSIS — M720 Palmar fascial fibromatosis [Dupuytren]: Secondary | ICD-10-CM | POA: Diagnosis not present

## 2016-06-08 DIAGNOSIS — M0609 Rheumatoid arthritis without rheumatoid factor, multiple sites: Secondary | ICD-10-CM | POA: Diagnosis not present

## 2016-06-08 DIAGNOSIS — Z79899 Other long term (current) drug therapy: Secondary | ICD-10-CM | POA: Diagnosis not present

## 2016-06-08 DIAGNOSIS — M353 Polymyalgia rheumatica: Secondary | ICD-10-CM | POA: Diagnosis not present

## 2016-06-14 DIAGNOSIS — H2512 Age-related nuclear cataract, left eye: Secondary | ICD-10-CM | POA: Diagnosis not present

## 2016-07-15 DIAGNOSIS — H02831 Dermatochalasis of right upper eyelid: Secondary | ICD-10-CM | POA: Diagnosis not present

## 2016-07-15 DIAGNOSIS — H0279 Other degenerative disorders of eyelid and periocular area: Secondary | ICD-10-CM | POA: Diagnosis not present

## 2016-07-15 DIAGNOSIS — H02413 Mechanical ptosis of bilateral eyelids: Secondary | ICD-10-CM | POA: Diagnosis not present

## 2016-07-15 DIAGNOSIS — H02834 Dermatochalasis of left upper eyelid: Secondary | ICD-10-CM | POA: Diagnosis not present

## 2016-07-15 DIAGNOSIS — H02423 Myogenic ptosis of bilateral eyelids: Secondary | ICD-10-CM | POA: Diagnosis not present

## 2016-08-03 DIAGNOSIS — M0609 Rheumatoid arthritis without rheumatoid factor, multiple sites: Secondary | ICD-10-CM | POA: Diagnosis not present

## 2016-08-03 DIAGNOSIS — M353 Polymyalgia rheumatica: Secondary | ICD-10-CM | POA: Diagnosis not present

## 2016-08-20 DIAGNOSIS — H109 Unspecified conjunctivitis: Secondary | ICD-10-CM | POA: Diagnosis not present

## 2016-08-31 DIAGNOSIS — R070 Pain in throat: Secondary | ICD-10-CM | POA: Diagnosis not present

## 2016-08-31 DIAGNOSIS — H1044 Vernal conjunctivitis: Secondary | ICD-10-CM | POA: Diagnosis not present

## 2016-09-28 DIAGNOSIS — I10 Essential (primary) hypertension: Secondary | ICD-10-CM | POA: Diagnosis not present

## 2016-09-28 DIAGNOSIS — M0609 Rheumatoid arthritis without rheumatoid factor, multiple sites: Secondary | ICD-10-CM | POA: Diagnosis not present

## 2016-09-28 DIAGNOSIS — E039 Hypothyroidism, unspecified: Secondary | ICD-10-CM | POA: Diagnosis not present

## 2016-10-06 DIAGNOSIS — R490 Dysphonia: Secondary | ICD-10-CM | POA: Insufficient documentation

## 2016-10-06 DIAGNOSIS — K219 Gastro-esophageal reflux disease without esophagitis: Secondary | ICD-10-CM | POA: Diagnosis not present

## 2016-10-06 DIAGNOSIS — R0989 Other specified symptoms and signs involving the circulatory and respiratory systems: Secondary | ICD-10-CM | POA: Insufficient documentation

## 2016-10-06 DIAGNOSIS — R198 Other specified symptoms and signs involving the digestive system and abdomen: Secondary | ICD-10-CM | POA: Insufficient documentation

## 2016-10-06 DIAGNOSIS — H903 Sensorineural hearing loss, bilateral: Secondary | ICD-10-CM | POA: Insufficient documentation

## 2016-10-06 DIAGNOSIS — Z974 Presence of external hearing-aid: Secondary | ICD-10-CM | POA: Diagnosis not present

## 2016-10-12 DIAGNOSIS — M353 Polymyalgia rheumatica: Secondary | ICD-10-CM | POA: Diagnosis not present

## 2016-10-12 DIAGNOSIS — Z79899 Other long term (current) drug therapy: Secondary | ICD-10-CM | POA: Diagnosis not present

## 2016-10-12 DIAGNOSIS — L603 Nail dystrophy: Secondary | ICD-10-CM | POA: Diagnosis not present

## 2016-10-12 DIAGNOSIS — E7800 Pure hypercholesterolemia, unspecified: Secondary | ICD-10-CM | POA: Diagnosis not present

## 2016-10-12 DIAGNOSIS — M0609 Rheumatoid arthritis without rheumatoid factor, multiple sites: Secondary | ICD-10-CM | POA: Diagnosis not present

## 2016-10-12 DIAGNOSIS — M720 Palmar fascial fibromatosis [Dupuytren]: Secondary | ICD-10-CM | POA: Diagnosis not present

## 2016-10-12 DIAGNOSIS — D692 Other nonthrombocytopenic purpura: Secondary | ICD-10-CM | POA: Diagnosis not present

## 2016-10-12 DIAGNOSIS — E039 Hypothyroidism, unspecified: Secondary | ICD-10-CM | POA: Diagnosis not present

## 2016-10-12 DIAGNOSIS — M2170 Unequal limb length (acquired), unspecified site: Secondary | ICD-10-CM | POA: Diagnosis not present

## 2016-10-12 DIAGNOSIS — I10 Essential (primary) hypertension: Secondary | ICD-10-CM | POA: Diagnosis not present

## 2016-10-12 DIAGNOSIS — Z0000 Encounter for general adult medical examination without abnormal findings: Secondary | ICD-10-CM | POA: Diagnosis not present

## 2016-11-23 DIAGNOSIS — Z0000 Encounter for general adult medical examination without abnormal findings: Secondary | ICD-10-CM | POA: Diagnosis not present

## 2016-11-23 DIAGNOSIS — L603 Nail dystrophy: Secondary | ICD-10-CM | POA: Diagnosis not present

## 2016-11-23 DIAGNOSIS — M0609 Rheumatoid arthritis without rheumatoid factor, multiple sites: Secondary | ICD-10-CM | POA: Diagnosis not present

## 2016-11-23 DIAGNOSIS — M353 Polymyalgia rheumatica: Secondary | ICD-10-CM | POA: Diagnosis not present

## 2016-11-23 DIAGNOSIS — E7800 Pure hypercholesterolemia, unspecified: Secondary | ICD-10-CM | POA: Diagnosis not present

## 2016-11-23 DIAGNOSIS — I10 Essential (primary) hypertension: Secondary | ICD-10-CM | POA: Diagnosis not present

## 2016-11-23 DIAGNOSIS — E039 Hypothyroidism, unspecified: Secondary | ICD-10-CM | POA: Diagnosis not present

## 2016-11-23 DIAGNOSIS — Z79899 Other long term (current) drug therapy: Secondary | ICD-10-CM | POA: Diagnosis not present

## 2016-11-23 DIAGNOSIS — M720 Palmar fascial fibromatosis [Dupuytren]: Secondary | ICD-10-CM | POA: Diagnosis not present

## 2016-11-23 DIAGNOSIS — Z96641 Presence of right artificial hip joint: Secondary | ICD-10-CM | POA: Diagnosis not present

## 2016-12-14 DIAGNOSIS — Z79899 Other long term (current) drug therapy: Secondary | ICD-10-CM | POA: Diagnosis not present

## 2016-12-14 DIAGNOSIS — M069 Rheumatoid arthritis, unspecified: Secondary | ICD-10-CM | POA: Diagnosis not present

## 2016-12-14 DIAGNOSIS — M0609 Rheumatoid arthritis without rheumatoid factor, multiple sites: Secondary | ICD-10-CM | POA: Diagnosis not present

## 2016-12-16 DIAGNOSIS — M7121 Synovial cyst of popliteal space [Baker], right knee: Secondary | ICD-10-CM | POA: Diagnosis not present

## 2016-12-16 DIAGNOSIS — M25561 Pain in right knee: Secondary | ICD-10-CM | POA: Diagnosis not present

## 2016-12-16 DIAGNOSIS — M25551 Pain in right hip: Secondary | ICD-10-CM | POA: Diagnosis not present

## 2016-12-28 DIAGNOSIS — R5381 Other malaise: Secondary | ICD-10-CM | POA: Diagnosis not present

## 2016-12-28 DIAGNOSIS — R0602 Shortness of breath: Secondary | ICD-10-CM | POA: Diagnosis not present

## 2016-12-28 DIAGNOSIS — M79601 Pain in right arm: Secondary | ICD-10-CM | POA: Diagnosis not present

## 2016-12-28 DIAGNOSIS — R5383 Other fatigue: Secondary | ICD-10-CM | POA: Diagnosis not present

## 2017-01-03 DIAGNOSIS — H578 Other specified disorders of eye and adnexa: Secondary | ICD-10-CM | POA: Diagnosis not present

## 2017-01-04 DIAGNOSIS — Z1231 Encounter for screening mammogram for malignant neoplasm of breast: Secondary | ICD-10-CM | POA: Diagnosis not present

## 2017-01-04 DIAGNOSIS — Z01419 Encounter for gynecological examination (general) (routine) without abnormal findings: Secondary | ICD-10-CM | POA: Diagnosis not present

## 2017-01-04 DIAGNOSIS — Z124 Encounter for screening for malignant neoplasm of cervix: Secondary | ICD-10-CM | POA: Diagnosis not present

## 2017-01-07 DIAGNOSIS — R0789 Other chest pain: Secondary | ICD-10-CM | POA: Diagnosis not present

## 2017-01-07 DIAGNOSIS — I10 Essential (primary) hypertension: Secondary | ICD-10-CM | POA: Diagnosis not present

## 2017-01-07 DIAGNOSIS — R0602 Shortness of breath: Secondary | ICD-10-CM | POA: Diagnosis not present

## 2017-01-18 DIAGNOSIS — E039 Hypothyroidism, unspecified: Secondary | ICD-10-CM | POA: Diagnosis not present

## 2017-01-18 DIAGNOSIS — Z79899 Other long term (current) drug therapy: Secondary | ICD-10-CM | POA: Diagnosis not present

## 2017-01-18 DIAGNOSIS — Z96641 Presence of right artificial hip joint: Secondary | ICD-10-CM | POA: Diagnosis not present

## 2017-01-18 DIAGNOSIS — M0609 Rheumatoid arthritis without rheumatoid factor, multiple sites: Secondary | ICD-10-CM | POA: Diagnosis not present

## 2017-01-18 DIAGNOSIS — Z0000 Encounter for general adult medical examination without abnormal findings: Secondary | ICD-10-CM | POA: Diagnosis not present

## 2017-01-18 DIAGNOSIS — I351 Nonrheumatic aortic (valve) insufficiency: Secondary | ICD-10-CM | POA: Diagnosis not present

## 2017-01-18 DIAGNOSIS — I10 Essential (primary) hypertension: Secondary | ICD-10-CM | POA: Diagnosis not present

## 2017-01-18 DIAGNOSIS — M720 Palmar fascial fibromatosis [Dupuytren]: Secondary | ICD-10-CM | POA: Diagnosis not present

## 2017-01-18 DIAGNOSIS — M353 Polymyalgia rheumatica: Secondary | ICD-10-CM | POA: Diagnosis not present

## 2017-01-18 DIAGNOSIS — L603 Nail dystrophy: Secondary | ICD-10-CM | POA: Diagnosis not present

## 2017-01-18 DIAGNOSIS — E7800 Pure hypercholesterolemia, unspecified: Secondary | ICD-10-CM | POA: Diagnosis not present

## 2017-01-24 DIAGNOSIS — R9439 Abnormal result of other cardiovascular function study: Secondary | ICD-10-CM | POA: Diagnosis not present

## 2017-01-24 DIAGNOSIS — M79601 Pain in right arm: Secondary | ICD-10-CM | POA: Diagnosis not present

## 2017-01-24 DIAGNOSIS — R5381 Other malaise: Secondary | ICD-10-CM | POA: Diagnosis not present

## 2017-01-24 DIAGNOSIS — R5383 Other fatigue: Secondary | ICD-10-CM | POA: Diagnosis not present

## 2017-01-26 DIAGNOSIS — R718 Other abnormality of red blood cells: Secondary | ICD-10-CM | POA: Diagnosis not present

## 2017-03-15 DIAGNOSIS — M720 Palmar fascial fibromatosis [Dupuytren]: Secondary | ICD-10-CM | POA: Diagnosis not present

## 2017-03-15 DIAGNOSIS — M0609 Rheumatoid arthritis without rheumatoid factor, multiple sites: Secondary | ICD-10-CM | POA: Diagnosis not present

## 2017-03-15 DIAGNOSIS — E7800 Pure hypercholesterolemia, unspecified: Secondary | ICD-10-CM | POA: Diagnosis not present

## 2017-03-15 DIAGNOSIS — Z79899 Other long term (current) drug therapy: Secondary | ICD-10-CM | POA: Diagnosis not present

## 2017-03-15 DIAGNOSIS — M353 Polymyalgia rheumatica: Secondary | ICD-10-CM | POA: Diagnosis not present

## 2017-03-15 DIAGNOSIS — R718 Other abnormality of red blood cells: Secondary | ICD-10-CM | POA: Diagnosis not present

## 2017-03-15 DIAGNOSIS — E039 Hypothyroidism, unspecified: Secondary | ICD-10-CM | POA: Diagnosis not present

## 2017-03-15 DIAGNOSIS — Z0000 Encounter for general adult medical examination without abnormal findings: Secondary | ICD-10-CM | POA: Diagnosis not present

## 2017-03-15 DIAGNOSIS — I10 Essential (primary) hypertension: Secondary | ICD-10-CM | POA: Diagnosis not present

## 2017-03-15 DIAGNOSIS — M2170 Unequal limb length (acquired), unspecified site: Secondary | ICD-10-CM | POA: Diagnosis not present

## 2017-04-02 ENCOUNTER — Ambulatory Visit (HOSPITAL_COMMUNITY)
Admission: EM | Admit: 2017-04-02 | Discharge: 2017-04-02 | Disposition: A | Discharge status: Home / Self Care | Payer: Medicare Other

## 2017-04-02 ENCOUNTER — Encounter (HOSPITAL_COMMUNITY): Payer: Self-pay

## 2017-04-02 DIAGNOSIS — S51819A Laceration without foreign body of unspecified forearm, initial encounter: Secondary | ICD-10-CM

## 2017-04-02 NOTE — ED Triage Notes (Addendum)
Patient presents to [facility] with complaint of Dog scratch on rt arm, patient is the owner of Dog and she is up to date on all shots

## 2017-04-02 NOTE — Discharge Instructions (Signed)
Try to keep the Steri-Strips clean and dry. May wear a dressing over the wound. Watch for any signs of infection. For problems may return especially infection.

## 2017-04-02 NOTE — ED Notes (Signed)
Applied dressing to wound by using curlex and coban with a non stick gauze.

## 2017-04-02 NOTE — ED Provider Notes (Signed)
[place]    CSN: [id number] Arrival date & time: 04/02/17  1614     History   Chief Complaint Chief Complaint  Patient presents with  . Abrasion    HPI Tonya Robinson is a 76 y.o. female.   76 year old female states her little puppy jumped up on her and scratched her on the right arm. She has a superficial skin tear in a triangular shape approximately 1 cm to each side. There was no dog bite. Puppy is up-to-date on its immunizations. Her last tetanus is less than 10 years.      Past Medical History:  Diagnosis Date  . Arthritis   . Fibromyalgia   . Hypertension   . Osteoporosis   . Polymyalgia rheumatica ([place])   . Thyroid disease   . 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 Rheumatoid arthritis ([place]) 05/08/2013  . Breast cancer screening 05/08/2013  . Need for prophylactic vaccination against Streptococcus pneumoniae (pneumococcus) 05/08/2013  . Screening, lipid 05/08/2013  . HTN (hypertension) 05/08/2013  . Arthritis   . Fibromyalgia   . Osteoporosis   . Thyroid disease     Past Surgical History:  Procedure Laterality Date  . ABDOMINAL HYSTERECTOMY    . BREAST LUMPECTOMY Right   . KNEE CARTILAGE SURGERY Right     OB History    No data available       Home Medications    Prior to Admission medications   Medication Sig Start Date End Date Taking? Authorizing Provider  Adalimumab (HUMIRA ) Inject into the skin.   Yes [provider]  amLODipine (NORVASC) 2.5 MG tablet Take 2.5 mg by mouth daily.   Yes [provider]  aspirin 81 MG tablet Take 81 mg by mouth daily.   Yes [provider]  calcium carbonate (OS-CAL) 600 MG TABS tablet Take 600 mg by mouth 2 (two) times daily with a meal.    Yes [provider]  fish oil-omega-3 fatty acids 1000 MG capsule Take 2 g by mouth daily.   Yes [provider]   FLUoxetine (PROZAC) 10 MG capsule TAKE ONE CAPSULE BY MOUTH ONCE DAILY 05/07/14  Yes Hassell Done, Mary-Margaret, FNP  levothyroxine (SYNTHROID, LEVOTHROID) 25 MCG tablet Take 1 tablet (25 mcg total) by mouth daily before breakfast. 09/06/13  Yes Vernie Shanks, MD  methotrexate (RHEUMATREX) 2.5 MG tablet Take 2.5 mg by mouth once a week. Caution:Chemotherapy. Protect from light. Takes 8 tablets weekly   Yes [provider]  metoprolol succinate (TOPROL-XL) 25 MG 24 hr tablet Take 25 mg by mouth daily.   Yes [provider]  predniSONE (DELTASONE) 1 MG tablet Take 1 mg by mouth daily. TAKE 3 QD   Yes [provider]  zinc gluconate 50 MG tablet Take 50 mg by mouth daily.   Yes [provider]  acetaminophen (TYLENOL) 500 MG tablet Take 500 mg by mouth every 6 (six) hours as needed for pain.    [provider]  ALPRAZolam Duanne Moron) 0.25 MG tablet Take 1 tab tid prn anxiety and hs prn insomnia. 07/17/14   Sharion Balloon, FNP  HYDROcodone-acetaminophen (NORCO/VICODIN) 5-325 MG per tablet Take 2 tablets by mouth every 6 (six) hours as needed. 11/20/13   Evelina Dun A, FNP  hydroxychloroquine (PLAQUENIL) 200 MG tablet Take by mouth 2 (two) times daily.    [provider]  lisinopril (PRINIVIL,ZESTRIL) 5 MG tablet Take 1 tablet (5 mg total) by mouth daily. 09/06/13   Vernie Shanks, MD    Family History Family History  Problem Relation Age of Onset  . Hypertension Mother   . Arthritis Mother   . Heart disease Father   . Arthritis Father     Social History Social History  Substance Use Topics  . Smoking status: Former Smoker    Packs/day: 0.25    Types: Cigarettes  . Smokeless tobacco: Never Used  . Alcohol use 0.6 oz/week    1 Glasses of wine per week     Allergies   Penicillins   Review of Systems Review of Systems  Constitutional: Negative.   Musculoskeletal: Negative.   Skin: Positive for wound.  Neurological: Negative.   All  other systems reviewed and are negative.    Physical Exam Triage Vital Signs ED Triage Vitals [04/02/17 1709]  Enc Vitals Group     BP (!) 139/39     Pulse Rate 66     Resp 15     Temp 98.1 F (36.7 C)     Temp Source Oral     SpO2 100 %     Weight      Height      Head Circumference      Peak Flow      Pain Score      Pain Loc      Pain Edu?      Excl. in [place]?    No data found.   Updated Vital Signs BP (!) 147/80   Pulse 66   Temp 98.1 F (36.7 C) (Oral)   Resp 15   SpO2 100%   Visual Acuity Right Eye Distance:   Left Eye Distance:   Bilateral Distance:    Right Eye Near:   Left Eye Near:    Bilateral Near:     Physical Exam  Constitutional: She is oriented to person, place, and time. She appears well-developed and well-nourished. No distress.  Eyes: EOM are normal.  Neck: Normal range of motion. Neck supple.  Cardiovascular: Normal rate.   Pulmonary/Chest: Effort normal. No respiratory distress.  Musculoskeletal: She exhibits no edema.  Neurological: She is alert and oriented to person, place, and time. She exhibits normal muscle tone.  Skin: Skin is warm and dry.  Triangular skin tear, shape. The base is in intact with the triangular dermis and the sides are approximately 1 cm in length. The skin tear folds nicely into place closing the tear.  Psychiatric: She has a normal mood and affect.  Nursing note and vitals reviewed.    UC Treatments / Results  Labs (all labs ordered are listed, but only abnormal results are displayed) Labs Reviewed - No data to display  EKG  EKG Interpretation None     Procedure: The small superficial wound was forced irrigation with saline and soap including the inside of the flap. No foreign body seen. The flap was closed over the wound and held together with Steri-Strips. A dressing was then applied.  Radiology No results found.  Procedures Procedures (including critical care time)  Medications Ordered in  UC Medications - No data to display   Initial Impression / Assessment and Plan / UC Course  I have reviewed the triage vital signs and the nursing notes.  Pertinent labs & imaging results that were available during my care of the patient were reviewed by me and considered in my medical decision making (  see chart for details).    Try to keep the Steri-Strips clean and dry. May wear a dressing over the wound. Watch for any signs of infection. For problems may return especially infection.     Final Clinical Impressions(s) / UC Diagnoses   Final diagnoses:  Skin tear of forearm without complication, initial encounter    New Prescriptions New Prescriptions   No medications on file     Controlled Substance Prescriptions [place] Controlled Substance Registry consulted? Not Applicable   Janne Napoleon, NP 04/02/17 [phone]

## 2017-04-14 DIAGNOSIS — M0609 Rheumatoid arthritis without rheumatoid factor, multiple sites: Secondary | ICD-10-CM | POA: Diagnosis not present

## 2017-04-14 DIAGNOSIS — I10 Essential (primary) hypertension: Secondary | ICD-10-CM | POA: Diagnosis not present

## 2017-04-14 DIAGNOSIS — Z79899 Other long term (current) drug therapy: Secondary | ICD-10-CM | POA: Diagnosis not present

## 2017-04-14 DIAGNOSIS — R61 Generalized hyperhidrosis: Secondary | ICD-10-CM | POA: Diagnosis not present

## 2017-04-14 DIAGNOSIS — M353 Polymyalgia rheumatica: Secondary | ICD-10-CM | POA: Diagnosis not present

## 2017-04-14 DIAGNOSIS — E7800 Pure hypercholesterolemia, unspecified: Secondary | ICD-10-CM | POA: Diagnosis not present

## 2017-04-14 DIAGNOSIS — G4762 Sleep related leg cramps: Secondary | ICD-10-CM | POA: Diagnosis not present

## 2017-04-14 DIAGNOSIS — M2170 Unequal limb length (acquired), unspecified site: Secondary | ICD-10-CM | POA: Diagnosis not present

## 2017-04-14 DIAGNOSIS — R718 Other abnormality of red blood cells: Secondary | ICD-10-CM | POA: Diagnosis not present

## 2017-04-14 DIAGNOSIS — M720 Palmar fascial fibromatosis [Dupuytren]: Secondary | ICD-10-CM | POA: Diagnosis not present

## 2017-04-14 DIAGNOSIS — E039 Hypothyroidism, unspecified: Secondary | ICD-10-CM | POA: Diagnosis not present

## 2017-04-18 DIAGNOSIS — Z23 Encounter for immunization: Secondary | ICD-10-CM | POA: Diagnosis not present

## 2017-04-25 DIAGNOSIS — E785 Hyperlipidemia, unspecified: Secondary | ICD-10-CM | POA: Diagnosis not present

## 2017-05-03 DIAGNOSIS — R5383 Other fatigue: Secondary | ICD-10-CM | POA: Diagnosis not present

## 2017-05-03 DIAGNOSIS — R9439 Abnormal result of other cardiovascular function study: Secondary | ICD-10-CM | POA: Diagnosis not present

## 2017-05-03 DIAGNOSIS — R5381 Other malaise: Secondary | ICD-10-CM | POA: Diagnosis not present

## 2017-05-03 DIAGNOSIS — I10 Essential (primary) hypertension: Secondary | ICD-10-CM | POA: Diagnosis not present

## 2017-05-10 DIAGNOSIS — I10 Essential (primary) hypertension: Secondary | ICD-10-CM | POA: Diagnosis not present

## 2017-05-10 DIAGNOSIS — E039 Hypothyroidism, unspecified: Secondary | ICD-10-CM | POA: Diagnosis not present

## 2017-05-10 DIAGNOSIS — M0609 Rheumatoid arthritis without rheumatoid factor, multiple sites: Secondary | ICD-10-CM | POA: Diagnosis not present

## 2017-05-23 DIAGNOSIS — M0609 Rheumatoid arthritis without rheumatoid factor, multiple sites: Secondary | ICD-10-CM | POA: Diagnosis not present

## 2017-05-23 DIAGNOSIS — Z79899 Other long term (current) drug therapy: Secondary | ICD-10-CM | POA: Diagnosis not present

## 2017-06-07 DIAGNOSIS — R5383 Other fatigue: Secondary | ICD-10-CM | POA: Diagnosis not present

## 2017-06-07 DIAGNOSIS — R5381 Other malaise: Secondary | ICD-10-CM | POA: Diagnosis not present

## 2017-06-07 DIAGNOSIS — R9439 Abnormal result of other cardiovascular function study: Secondary | ICD-10-CM | POA: Diagnosis not present

## 2017-06-07 DIAGNOSIS — R0602 Shortness of breath: Secondary | ICD-10-CM | POA: Diagnosis not present

## 2017-06-08 ENCOUNTER — Ambulatory Visit
Admission: RE | Admit: 2017-06-08 | Discharge: 2017-06-08 | Disposition: A | Discharge status: Home / Self Care | Payer: Medicare Other | Source: Ambulatory Visit | Attending: Cardiology | Admitting: Cardiology

## 2017-06-08 ENCOUNTER — Other Ambulatory Visit: Payer: Self-pay | Admitting: Cardiology

## 2017-06-08 DIAGNOSIS — R0602 Shortness of breath: Secondary | ICD-10-CM

## 2017-06-08 DIAGNOSIS — R05 Cough: Secondary | ICD-10-CM | POA: Diagnosis not present

## 2017-06-08 IMAGING — DX DG CHEST 2V
2 series · 2 of 2 positions shown · non-contrast
Comparison: PA and lateral chest [DATE] [DATE].

CLINICAL DATA: Shortness of breath, dry cough, fatigue and night
sweats of unknown duration.

EXAM:
CHEST  2 VIEW

[dg chest 2 view (1 of 2)]
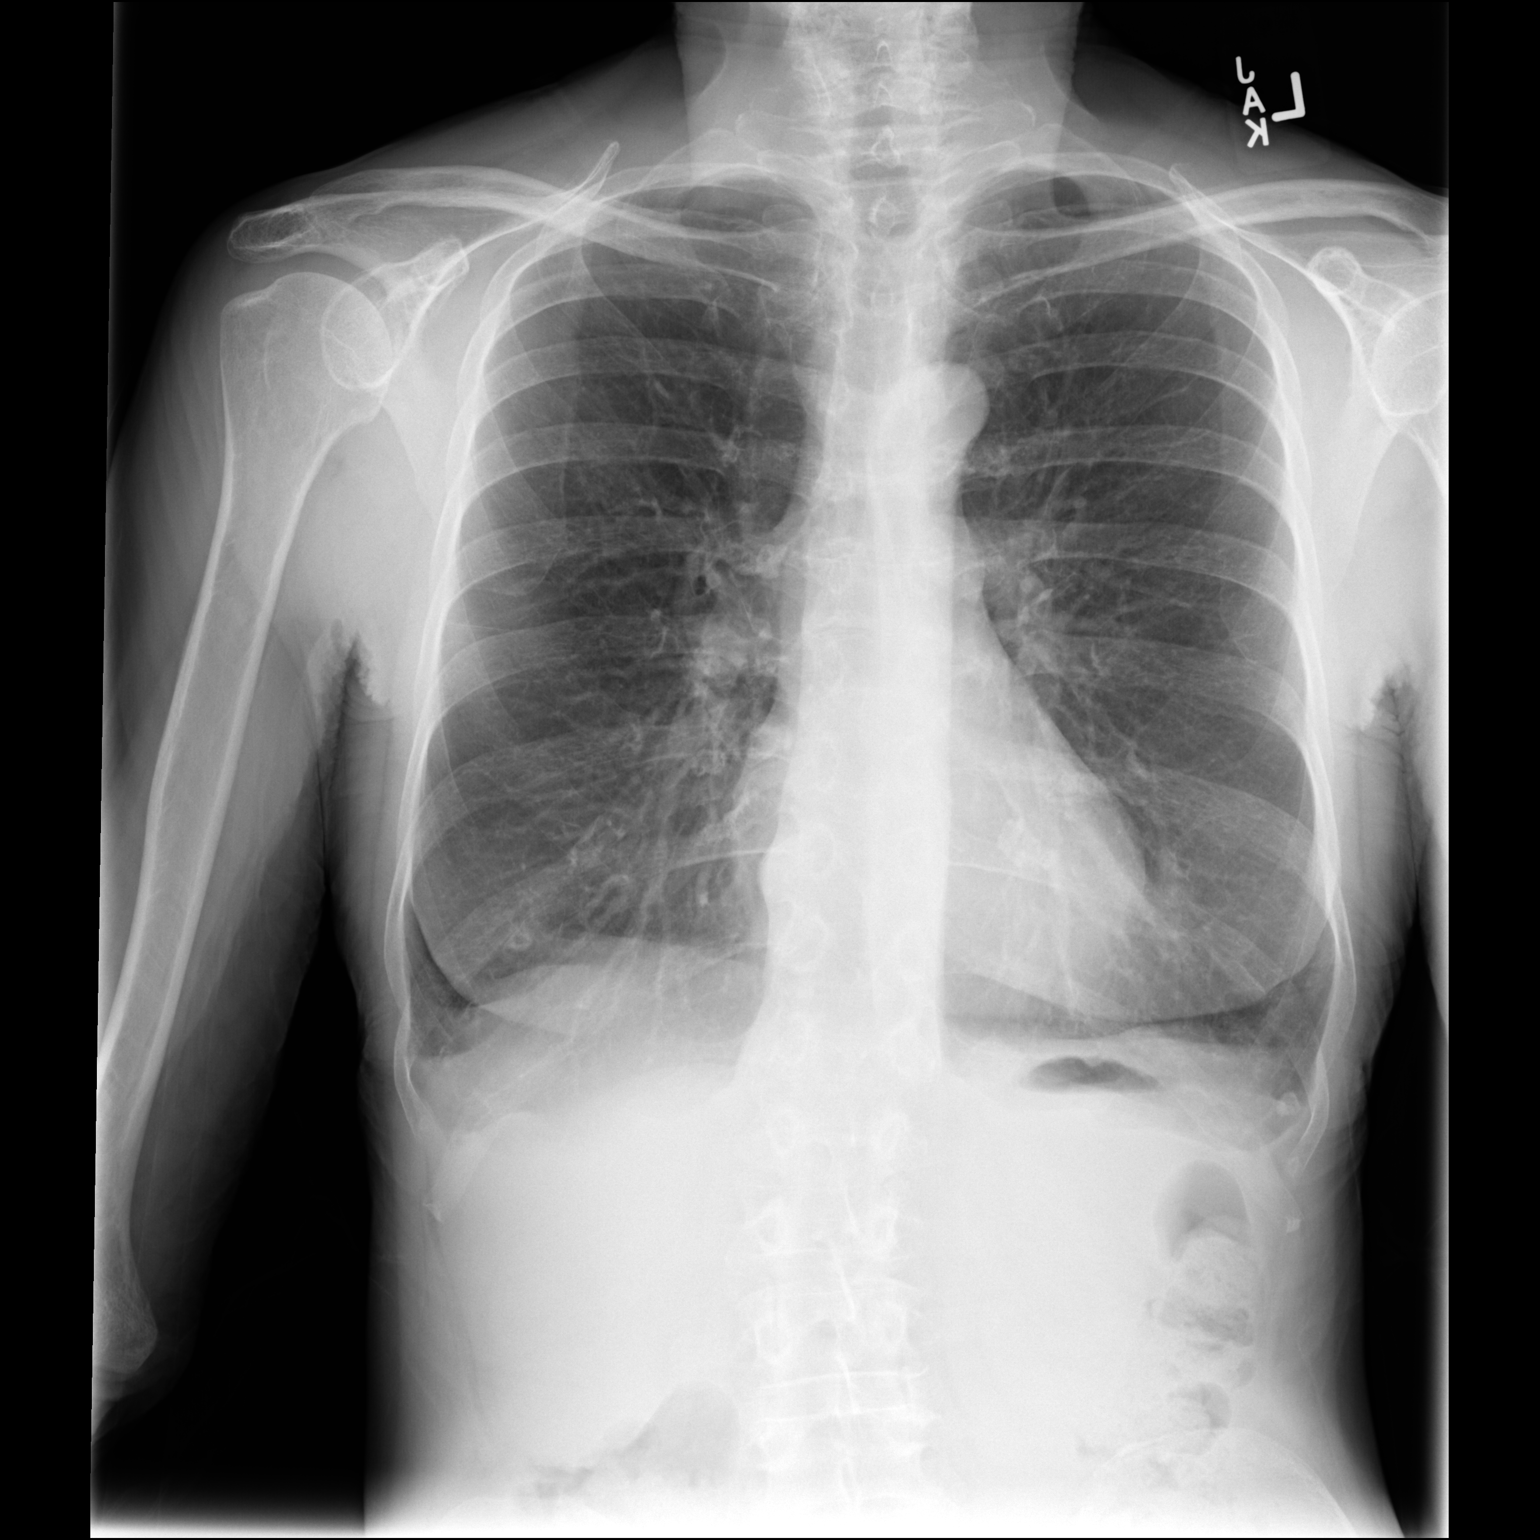

[dg chest 2 view (2 of 2)]
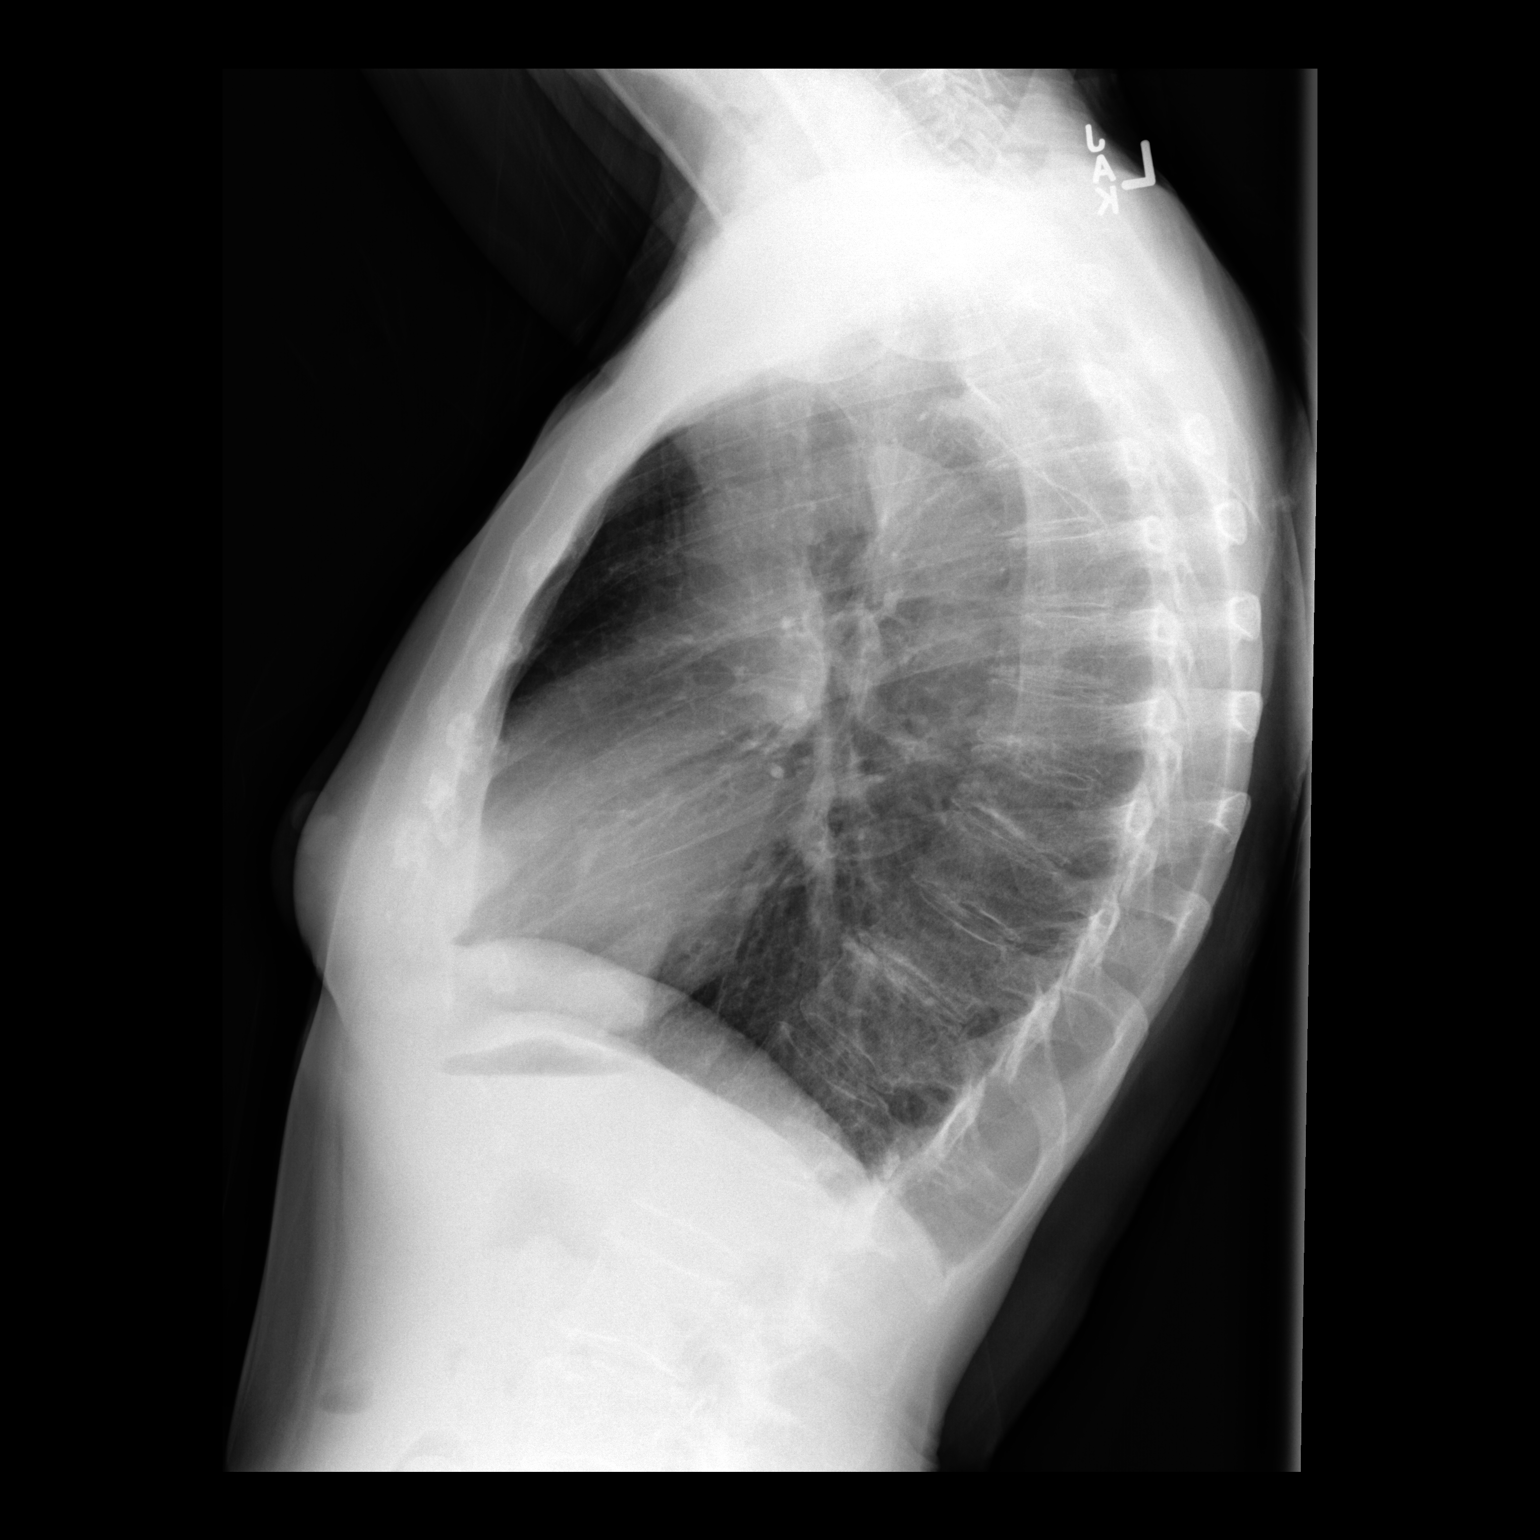

[2 of 2 positions shown; findings below may reference images not displayed]

FINDINGS: The chest is hyperexpanded with attenuation of the pulmonary
vasculature. Lungs are clear. No pneumothorax or pleural effusion.
Heart size is normal.
IMPRESSION: COPD without acute disease.

## 2017-06-26 NOTE — Progress Notes (Signed)
Labs 11/23/2016: LFT normal, HB 13.0/HCT 38.2, microcytic indicis, platelets 242.  CRP 0.6.  Sedimentation rate 7.0.  TSH minimally elevated at 5.56,, free thyroxine index, T3 uptake and T4 normal.  Total cholesterol 219, triglycerides 49, HDL 106, LDL 103.  Non-HDL cholesterol 113.  Serum glucose 81 mg, BUN 21, creatinine 0.97, eGFR 56 mL. 

## 2017-06-26 NOTE — H&P (View-Only) (Signed)
Labs 11/23/2016: LFT normal, HB 13.0/HCT 38.2, microcytic indicis, platelets 242.  CRP 0.6.  Sedimentation rate 7.0.  TSH minimally elevated at 5.56,, free thyroxine index, T3 uptake and T4 normal.  Total cholesterol 219, triglycerides 49, HDL 106, LDL 103.  Non-HDL cholesterol 113.  Serum glucose 81 mg, BUN 21, creatinine 0.97, eGFR 56 mL.

## 2017-06-26 NOTE — H&P (Signed)
Tonya Robinson 2017/06/10 3:15 PM Location: [facility] Patient #: [id number] DOB: Nov 25, 1940 Divorced / Language: Tonya Robinson / Race: White Female   History of Present Illness Tonya Page MD; 06-10-17 6:39 PM) Patient words: 1 mon f/u bp, abn nuc; Last office visit 05/03/17.  The patient is a 76 year old female who presents for a follow-up for Shortness of breath. She has a history of polymyalgia rheumatica and RA with Raynaud's disease, hypertension. Chest x-ray in 2017 revealed hyperinflated lungs and was told this could indicate COPD. She has a very remote history of tobacco use for approximately 4 years in her late teens. She was treated conservatively, however or the past year and a half, she has noticed gradual worsening dyspnea, she is to walk for 2 miles a day that now she gets short of breath walking just have, and has reduced her physical activity significantly. She was seen by [place] in Sept 2018 months ago when due to low blood pressure, metoprolol succinate was reduced in half. She now presents for follow-up with main complaint being exercise intolerance and shortness of breath.  She has no history of hypertension and hyperlipidemia although her LDL is slightly high, she is markedly elevated HDL. Nuclear stress test on 01/07/17 had revealed 2 mm horizontal ST depression with week exercise and had exertional chest pain and dyspnea. Myocardial perfusion study was normal and hence considered intermediate risk study. An echocardiogram revealed mild to moderate aortic regurgitation and mild to moderate posteriorly directed MR and no evidence of pulmonary hypertension.   Problem List/Past Medical (April Harrington; 06-10-2017 3:41 PM) Polymyalgia rheumatica (M35.3)  Hypothyroidism (E03.9)  Clinical depression (F32.9)  Fibromyalgia (M79.7)  Osteoarthritis (M19.90)  Anxiety (F41.9)  Hypertension, essential, benign (I10)  Labwork  04/27/2017: Cholesterol 248,  triglycerides 84, HDL 113, LDL 118. Labs 11/23/2016: LFT normal, HB 13.0/HCT 38.2, microcytic indicis, platelets 242. CRP 0.6. Sedimentation rate 7.0. TSH minimally elevated at 5.56,, free thyroxine index, T3 uptake and T4 normal. Total cholesterol 219, triglycerides 49, HDL 106, LDL 103. Non-HDL cholesterol 113. Serum glucose 81 mg, BUN 21, creatinine 0.97, eGFR 56 mL. 05/08/2013: Total cholesterol 209, triglycerides 76, HDL 126, LDL 68, TSH 3.3, vitamin D 53.4, sedimentation rate negative, CMP normal Raynaud's disease without gangrene (I73.00)  Rheumatoid arthritis, involving unspecified site, unspecified rheumatoid factor presence (M06.9)  Malaise and fatigue (R53.81, R53.83)  Abnormal nuclear stress test (R94.39)  Mild hyperlipidemia (E78.5)   Allergies (April Harrington; June 10, 2017 2:47 PM) Penicillins  Rash.  Family History (April Harrington; 10-Jun-2017 2:58 PM) Mother  Deceased. at age 25 from a CVA (was her 1st); Hx of HTN Father  Deceased. at age 56 from Heart complications; Hx of Valve Replacement Brother 3  1-Older-deceased; 2-Younger - All have HTN, Renal failure  Social History (April Harrington; 06/10/17 2:47 PM) Current tobacco use  Former smoker. Smoked during ages [age] Alcohol Use  Occasional alcohol use. Marital status  Divorced. Living Situation  Lives alone. Number of Children  1.  Past Surgical History (April Harrington; 2017-06-10 2:47 PM) Hysterectomy (not due to cancer) - Complete [09/1974]: Arthroscopic Knee Surgery - Right [01/2011]: Total Hip Replacement - Right [12/2013]: Dental surgery [04/2017]:  Medication History (April Harrington; 06/10/17 2:57 PM) Metoprolol Succinate ER (25MG Tablet ER 24HR,  (one half) Tablet Oral daily, Taken starting 05/03/2017) Active. (decreased dose to 12.52m daily) Erythromycin Base (500MG Tablet, 2 Oral one hour prior to dental visit) Active. Levothyroxine Sodium (25MCG Tablet, 1 Oral daily)  Active. AmLODIPine Besylate (2.5MG Tablet,  1 Oral daily) Active. Methotrexate (2.5MG Tablet, 8 Oral weekly) Active. PredniSONE (1MG Tablet, 2-3 Oral daily) Active. Folic Acid (1MG Tablet, 2 Oral daily) Active. ALPRAZolam (0.25MG Tablet, 1 Oral as needed) Active. Aspirin EC (81MG Tablet DR, 1 Oral daily) Active. Vitamin B12 (1000MCG Tablet ER, 1 Oral daily) Active. Vitamin D3 (1000UNIT Tablet, 1 Oral daily) Active. Fish Oil (1000MG Capsule, 1 Oral daily) Active. (w/ Omega 3 358m) Calcium Carbonate (600MG Tablet, 3 Oral daily) Active. (w/ Vit D3 500IU) Magnesium Oxide (400MG Tablet, 1 Oral daily) Active. Biotin (5000MCG Tablet, 1 Oral daily) Active. Humira (Subcutaneous every other friday) Specific strength unknown - Active. Medications Reconciled (verbally with pt; no list or medication present)  Diagnostic Studies History (Tonya Page MD; 06/07/2017 3:43 PM) Nuclear stress test [01/07/2017]: 01/07/2017 1. The resting electrocardiogram demonstrated normal sinus rhythm, normal resting conduction, no resting arrhythmias and normal rest repolarization. The stress electrocardiogram was positive for ischemia with 2 mm horizontal ST depression noed at peak exercise persisting for > 2 minutes into recovery. Stress symptoms included dyspnea and chest pressure. Patient exercised on Bruce protocol for 7:00 minutes and achieved 8.55 METS. Stress test terminated due to chest pressure, dyspnea and 88% MPHR achieved (Target HR >85%). 2. Myocardial perfusion imaging is normal. Overall left ventricular systolic function was normal without regional wall motion abnormalities. The left ventricular ejection fraction was 80%. This is an intermediate risk study, clinical correlation recommended in view of symptoms and abnormal EKG. Echocardiogram [01/18/2017]: 01/18/2017 Left ventricle cavity is normal in size. Normal global wall motion. Doppler evidence of grade I (impaired) diastolic  dysfunction, normal LAP. Calculated EF 56%. Trileaflet aortic valve with mild to moderate regurgitation. Mild to moderate posteriorly directed mitral regurgitation. Mild tricuspid regurgitation. No evidence of pulmonary hypertension. Compared to the study done on 10/29/2015, mitral regurgitation is slightly worse. Otherwise no significant change. No change in MR from 03/23/2012, mild to moderate. Treadmill stress test [10/27/2015]: 10/27/2015 Indications: Shortness of breath,HTN The resting electrocardiogram demonstrated normal sinus rhythm, normal resting conduction, no resting arrhythmias and normal rest repolarization. The stress electrocardiogram was normal. There were no significant arrhythmias. Patient exercised on Bruce protocol for 8:17 minutes and achieved 101 % of Max Predicted HR (Target HR was >85% MPHR) and 10.16 METS. Stress symptoms included fatigue and dyspnea. Normal BP response. Exercise capacity was normal. Impression: Normal stress EKG.  Other Problems (April Harrington; 06/07/2017 2:47 PM) Dizziness (R42)     Review of Systems (Tonya PageMD; 06/07/2017 3:40 PM) General Present- Fatigue (improving). Not Present- Anorexia and Fever. Respiratory Present- Decreased Exercise Tolerance and Difficulty Breathing on Exertion (stable). Not Present- Cough. Cardiovascular Not Present- Chest Pain, Claudications, Edema, Orthopnea, Palpitations and Paroxysmal Nocturnal Dyspnea. Gastrointestinal Not Present- Black, Tarry Stool, Change in Bowel Habits and Nausea. Musculoskeletal Present- Joint Pain (in hands and wrist, ankles). Neurological Present- Paresthesias (numbness and tingling in hands). Not Present- Dizziness, Focal Neurological Symptoms and Syncope. Endocrine Not Present- Cold Intolerance, Excessive Sweating, Heat Intolerance and Thyroid Problems. Hematology Not Present- Anemia, Easy Bruising, Petechiae and Prolonged Bleeding. All other systems negative  Vitals (April  Harrington; 06/07/2017 2:59 PM) 06/07/2017 2:49 PM Weight: 100.13 lb Height: 61in Body Surface Area: 1.41 m Body Mass Index: 18.92 kg/m  Pulse: 86 (Regular)  P.OX: 98% (Room air) BP: 102/60 (Sitting, Left Arm, Standard)       Physical Exam (Tonya PageMD; 06/07/2017 6:39 PM) General Mental Status-Alert. General Appearance-Cooperative and Appears younger than stated age. Build & Nutrition-Lean, Moderately built  and Petite.  Head and Neck Thyroid Gland Characteristics - normal size and consistency and no palpable nodules.  Chest and Lung Exam Chest and lung exam reveals -quiet, even and easy respiratory effort with no use of accessory muscles, non-tender and on auscultation, normal breath sounds, no adventitious sounds.  Cardiovascular Cardiovascular examination reveals -normal heart sounds, regular rate and rhythm with no murmurs, carotid auscultation reveals no bruits, abdominal aorta auscultation reveals no bruits and no prominent pulsation, femoral artery auscultation bilaterally reveals normal pulses, no bruits, no thrills and normal pedal pulses bilaterally.  Abdomen Palpation/Percussion Normal exam - Non Tender and No hepatosplenomegaly.  Peripheral Vascular Lower Extremity Inspection - Bilateral - Varicose veins.  Neurologic Neurologic evaluation reveals -alert and oriented x 3 with no impairment of recent or remote memory. Motor-Grossly intact without any focal deficits.  Musculoskeletal Global Assessment Left Lower Extremity - no deformities, masses or tenderness, no known fractures. Right Lower Extremity - no deformities, masses or tenderness, no known fractures.    Assessment & Plan Tonya Page MD; 06/07/2017 6:40 PM) Abnormal nuclear stress test (R94.39) Story: 01/07/2017 1. The resting electrocardiogram demonstrated normal sinus rhythm, normal resting conduction, no resting arrhythmias and normal rest repolarization. The  stress electrocardiogram was positive for ischemia with 2 mm horizontal ST depression noed at peak exercise persisting for > 2 minutes into recovery. Stress symptoms included dyspnea and chest pressure. Patient exercised on Bruce protocol for 7:00 minutes and achieved 8.55 METS. Stress test terminated due to chest pressure, dyspnea and 88% MPHR achieved (Target HR >85%). 2. Myocardial perfusion imaging is normal. Overall left ventricular systolic function was normal without regional wall motion abnormalities. The left ventricular ejection fraction was 80%. This is an intermediate risk study, clinical correlation recommended in view of symptoms and abnormal EKG. Shortness of breath on exertion (R06.02) Story: Echocardiogram 01/18/2017 Left ventricle cavity is normal in size. Normal global wall motion. Doppler evidence of grade I (impaired) diastolic dysfunction, normal LAP. Calculated EF 56%. Trileaflet aortic valve with mild to moderate regurgitation. Mild to moderate posteriorly directed mitral regurgitation. Mild tricuspid regurgitation. No evidence of pulmonary hypertension. Compared to the study done on 10/29/2015, mitral regurgitation is slightly worse. Otherwise no significant change. No change in MR from 03/23/2012, mild to moderate. Impression: Chest x-ray 09/30/2015: COPD with no acute cardiopulmonary disease Future Plans 46/56/8127: METABOLIC PANEL, BASIC (51700) - one time 06/24/2017: CBC & PLATELETS (AUTO) (17494) - one time 06/24/2017: PT (PROTHROMBIN TIME) (49675) - one time Malaise and fatigue (R53.81) Impression: EKG 12/28/2016: Normal sinus rhythm at 83 bpm, normal axis, normal interval, no evidence of ischemia. Normal EKG. No changed from EKG 10/15/2015 Mild hyperlipidemia (E78.5) Current Plans Started Atorvastatin Calcium 10MG,  Tablet daily, #30, 30 days starting 06/07/2017, No Refill. Labwork Story: 04/27/2017: Cholesterol 248, triglycerides 84, HDL 113, LDL 118.  Labs  11/23/2016: LFT normal, HB 13.0/HCT 38.2, microcytic indicis, platelets 242. CRP 0.6. Sedimentation rate 7.0. TSH minimally elevated at 5.56,, free thyroxine index, T3 uptake and T4 normal. Total cholesterol 219, triglycerides 49, HDL 106, LDL 103. Non-HDL cholesterol 113. Serum glucose 81 mg, BUN 21, creatinine 0.97, eGFR 56 mL.  05/08/2013: Total cholesterol 209, triglycerides 76, HDL 126, LDL 68, TSH 3.3, vitamin D 53.4, sedimentation rate negative, CMP normal  Note:. Recommendations:  She is presenting for follow-up and evaluation of continued fatigue and shortness of breath even with minimal activities, last seen by [place] about 6 weeks ago. I'm extremely concerned that she used to walk for 2 miles  a day now she is walking half a mile a day with perform shortness of breath. I would like to obtain a chest x-ray PA and lateral view and in view of connective tissue disease, would like to exclude pulnarly fibrosis although I do not hear any significant lung abnormalities on auscultation.   I would like to set her up for a right and left heart catheterization to evaluate her symptoms in view of abnormal stress test and persistent dyspnea. Mitral regurgitation does not appear to be more than mild to at most moderate. Also I discussed regarding very mild hyperlipidemia, she has markedly elevated HDL, but in view of her symptoms I have convinced her to start with atorvastatin 10 mg one half tablet daily until CAD is excluded. I'll see her back after these tests and I'll make further recommendations  CC: Dr. Yaakov Guthrie    Signed by Tonya Page, MD (06/07/2017 6:41 PM)

## 2017-06-29 DIAGNOSIS — H04123 Dry eye syndrome of bilateral lacrimal glands: Secondary | ICD-10-CM | POA: Diagnosis not present

## 2017-07-03 DIAGNOSIS — R0602 Shortness of breath: Secondary | ICD-10-CM | POA: Diagnosis present

## 2017-07-03 DIAGNOSIS — R9439 Abnormal result of other cardiovascular function study: Secondary | ICD-10-CM | POA: Diagnosis present

## 2017-07-04 DIAGNOSIS — R0602 Shortness of breath: Secondary | ICD-10-CM | POA: Diagnosis not present

## 2017-07-08 ENCOUNTER — Encounter (HOSPITAL_COMMUNITY): Payer: Self-pay | Admitting: Cardiology

## 2017-07-08 ENCOUNTER — Encounter (HOSPITAL_COMMUNITY)
Admission: RE | Disposition: A | Discharge status: Home / Self Care | Payer: Self-pay | Source: Ambulatory Visit | Attending: Cardiology

## 2017-07-08 ENCOUNTER — Other Ambulatory Visit: Payer: Self-pay

## 2017-07-08 ENCOUNTER — Ambulatory Visit (HOSPITAL_COMMUNITY)
Admission: RE | Admit: 2017-07-08 | Discharge: 2017-07-08 | Disposition: A | Discharge status: Home / Self Care | Payer: Medicare Other | Source: Ambulatory Visit | Attending: Cardiology | Admitting: Cardiology

## 2017-07-08 DIAGNOSIS — I10 Essential (primary) hypertension: Secondary | ICD-10-CM | POA: Diagnosis not present

## 2017-07-08 DIAGNOSIS — Z8249 Family history of ischemic heart disease and other diseases of the circulatory system: Secondary | ICD-10-CM | POA: Insufficient documentation

## 2017-07-08 DIAGNOSIS — Z7982 Long term (current) use of aspirin: Secondary | ICD-10-CM | POA: Diagnosis not present

## 2017-07-08 DIAGNOSIS — I340 Nonrheumatic mitral (valve) insufficiency: Secondary | ICD-10-CM | POA: Insufficient documentation

## 2017-07-08 DIAGNOSIS — I7300 Raynaud's syndrome without gangrene: Secondary | ICD-10-CM | POA: Diagnosis not present

## 2017-07-08 DIAGNOSIS — R0609 Other forms of dyspnea: Secondary | ICD-10-CM | POA: Diagnosis not present

## 2017-07-08 DIAGNOSIS — Z7952 Long term (current) use of systemic steroids: Secondary | ICD-10-CM | POA: Insufficient documentation

## 2017-07-08 DIAGNOSIS — Z79899 Other long term (current) drug therapy: Secondary | ICD-10-CM | POA: Diagnosis not present

## 2017-07-08 DIAGNOSIS — R9439 Abnormal result of other cardiovascular function study: Secondary | ICD-10-CM | POA: Diagnosis present

## 2017-07-08 DIAGNOSIS — E039 Hypothyroidism, unspecified: Secondary | ICD-10-CM | POA: Diagnosis not present

## 2017-07-08 DIAGNOSIS — Z823 Family history of stroke: Secondary | ICD-10-CM | POA: Diagnosis not present

## 2017-07-08 DIAGNOSIS — M069 Rheumatoid arthritis, unspecified: Secondary | ICD-10-CM | POA: Diagnosis not present

## 2017-07-08 DIAGNOSIS — R0602 Shortness of breath: Secondary | ICD-10-CM | POA: Diagnosis present

## 2017-07-08 DIAGNOSIS — E785 Hyperlipidemia, unspecified: Secondary | ICD-10-CM | POA: Insufficient documentation

## 2017-07-08 DIAGNOSIS — R9431 Abnormal electrocardiogram [ECG] [EKG]: Secondary | ICD-10-CM | POA: Diagnosis not present

## 2017-07-08 DIAGNOSIS — Z87891 Personal history of nicotine dependence: Secondary | ICD-10-CM | POA: Insufficient documentation

## 2017-07-08 HISTORY — PX: RIGHT/LEFT HEART CATH AND CORONARY ANGIOGRAPHY: CATH118266

## 2017-07-08 LAB — POCT I-STAT 3, VENOUS BLOOD GAS (G3P V)
Acid-Base Excess: 1 mmol/L (ref 0.0–2.0)
Acid-Base Excess: 3 mmol/L — ABNORMAL HIGH (ref 0.0–2.0)
Bicarbonate: 25.3 mmol/L (ref 20.0–28.0)
Bicarbonate: 26 mmol/L (ref 20.0–28.0)
O2 Saturation: 75 %
O2 Saturation: 73 %
TCO2: 26 mmol/L (ref 22–32)
TCO2: 27 mmol/L (ref 22–32)
pCO2, Ven: 36.2 mmHg — ABNORMAL LOW (ref 44.0–60.0)
pCO2, Ven: 33.7 mmHg — ABNORMAL LOW (ref 44.0–60.0)
pH, Ven: 7.453 — ABNORMAL HIGH (ref 7.250–7.430)
pH, Ven: 7.494 — ABNORMAL HIGH (ref 7.250–7.430)
pO2, Ven: 38 mmHg (ref 32.0–45.0)
pO2, Ven: 35 mmHg (ref 32.0–45.0)

## 2017-07-08 LAB — POCT I-STAT 3, ART BLOOD GAS (G3+)
Acid-Base Excess: 2 mmol/L (ref 0.0–2.0)
Bicarbonate: 25.2 mmol/L (ref 20.0–28.0)
O2 Saturation: 97 %
TCO2: 26 mmol/L (ref 22–32)
pCO2 arterial: 32.1 mmHg (ref 32.0–48.0)
pH, Arterial: 7.503 — ABNORMAL HIGH (ref 7.350–7.450)
pO2, Arterial: 84 mmHg (ref 83.0–108.0)

## 2017-07-08 SURGERY — RIGHT/LEFT HEART CATH AND CORONARY ANGIOGRAPHY
Anesthesia: LOCAL

## 2017-07-08 MED ORDER — SODIUM CHLORIDE 0.9 % IV SOLN
INTRAVENOUS | Status: AC | PRN
  Administered 2017-07-08: 500 mL via INTRAVENOUS

## 2017-07-08 MED ORDER — IOPAMIDOL (ISOVUE-370) INJECTION 76%
INTRAVENOUS | Status: AC
  Filled 2017-07-08: qty 100

## 2017-07-08 MED ORDER — FENTANYL CITRATE (PF) 100 MCG/2ML IJ SOLN
INTRAMUSCULAR | Status: AC
  Filled 2017-07-08: qty 2

## 2017-07-08 MED ORDER — SODIUM CHLORIDE 0.9 % IV SOLN
INTRAVENOUS | Status: DC
  Administered 2017-07-08: 06:00:00 via INTRAVENOUS

## 2017-07-08 MED ORDER — NITROGLYCERIN 1 MG/10 ML FOR IR/CATH LAB
INTRA_ARTERIAL | Status: AC
  Filled 2017-07-08: qty 10

## 2017-07-08 MED ORDER — SODIUM CHLORIDE 0.9% FLUSH: 3.0000 mL | Freq: Two times a day (BID) | INTRAVENOUS | Status: DC

## 2017-07-08 MED ORDER — LIDOCAINE HCL (PF) 1 % IJ SOLN
INTRAMUSCULAR | Status: DC | PRN
Start: 2017-07-08 — End: 2017-07-08
  Administered 2017-07-08: 2 mL

## 2017-07-08 MED ORDER — HEPARIN (PORCINE) IN NACL 2-0.9 UNIT/ML-% IJ SOLN
INTRAMUSCULAR | Status: AC | PRN
  Administered 2017-07-08: 1000 mL

## 2017-07-08 MED ORDER — LIDOCAINE HCL (PF) 1 % IJ SOLN
INTRAMUSCULAR | Status: AC
  Filled 2017-07-08: qty 30

## 2017-07-08 MED ORDER — SODIUM CHLORIDE 0.9% FLUSH: 3.0000 mL | INTRAVENOUS | Status: DC | PRN

## 2017-07-08 MED ORDER — VERAPAMIL HCL 2.5 MG/ML IV SOLN
INTRA_ARTERIAL | Status: DC | PRN
  Administered 2017-07-08: 10 mL via INTRA_ARTERIAL

## 2017-07-08 MED ORDER — HEPARIN (PORCINE) IN NACL 2-0.9 UNIT/ML-% IJ SOLN
INTRAMUSCULAR | Status: AC
  Filled 2017-07-08: qty 1000

## 2017-07-08 MED ORDER — SODIUM CHLORIDE 0.9 % WEIGHT BASED INFUSION: 1.0000 mL/kg/h | INTRAVENOUS | Status: DC

## 2017-07-08 MED ORDER — FENTANYL CITRATE (PF) 100 MCG/2ML IJ SOLN
INTRAMUSCULAR | Status: DC | PRN
  Administered 2017-07-08: 25 ug via INTRAVENOUS

## 2017-07-08 MED ORDER — VERAPAMIL HCL 2.5 MG/ML IV SOLN
INTRAVENOUS | Status: AC
  Filled 2017-07-08: qty 2

## 2017-07-08 MED ORDER — SODIUM CHLORIDE 0.9 % IV SOLN: 250.0000 mL | INTRAVENOUS | Status: DC | PRN

## 2017-07-08 MED ORDER — MIDAZOLAM HCL 2 MG/2ML IJ SOLN
INTRAMUSCULAR | Status: DC | PRN
  Administered 2017-07-08: 1 mg via INTRAVENOUS

## 2017-07-08 MED ORDER — VERAPAMIL HCL 2.5 MG/ML IV SOLN: INTRAVENOUS | Status: DC | PRN

## 2017-07-08 MED ORDER — HEPARIN SODIUM (PORCINE) 1000 UNIT/ML IJ SOLN
INTRAMUSCULAR | Status: AC
  Filled 2017-07-08: qty 1

## 2017-07-08 MED ORDER — MIDAZOLAM HCL 2 MG/2ML IJ SOLN
INTRAMUSCULAR | Status: AC
  Filled 2017-07-08: qty 2

## 2017-07-08 MED ORDER — ASPIRIN 81 MG PO CHEW: 81.0000 mg | CHEWABLE_TABLET | ORAL | Status: DC

## 2017-07-08 MED ORDER — HEPARIN SODIUM (PORCINE) 1000 UNIT/ML IJ SOLN
INTRAMUSCULAR | Status: DC | PRN
  Administered 2017-07-08: 3000 [IU] via INTRAVENOUS

## 2017-07-08 SURGICAL SUPPLY — 11 items
CATH BALLN WEDGE 5F 110CM (CATHETERS) ×1 IMPLANT
CATH OPTITORQUE TIG 4.0 5F (CATHETERS) ×1 IMPLANT
DEVICE RAD TR BAND REGULAR (VASCULAR PRODUCTS) ×1 IMPLANT
GLIDESHEATH SLEND A-KIT 6F 20G (SHEATH) ×1 IMPLANT
GUIDEWIRE INQWIRE 1.5J.035X260 (WIRE) IMPLANT
INQWIRE 1.5J .035X260CM (WIRE) ×2
KIT HEART LEFT (KITS) ×2 IMPLANT
PACK CARDIAC CATHETERIZATION (CUSTOM PROCEDURE TRAY) ×2 IMPLANT
SHEATH GLIDE SLENDER 4/5FR (SHEATH) ×1 IMPLANT
TRANSDUCER W/STOPCOCK (MISCELLANEOUS) ×2 IMPLANT
TUBING CIL FLEX 10 FLL-RA (TUBING) ×2 IMPLANT

## 2017-07-08 NOTE — Interval H&P Note (Signed)
History and Physical Interval Note:  07/08/2017 7:34 AM  Tonya Robinson  has presented today for surgery, with the diagnosis of abnormal stress - shortness of breath  The various methods of treatment have been discussed with the patient and family. After consideration of risks, benefits and other options for treatment, the patient has consented to  Procedure(s): RIGHT/LEFT HEART CATH AND CORONARY ANGIOGRAPHY (N/A) and possible angioplasty as a surgical intervention .  The patient's history has been reviewed, patient examined, no change in status, stable for surgery.  I have reviewed the patient's chart and labs.  Questions were answered to the patient's satisfaction.   Symptom Status: Ischemic Symptoms Non-invasive Testing: Intermediate Risk If no or indeterminate stress test, FFR/iFR results in all diseased vessels: N/A Diabetes Mellitus: No S/P CABG: No Antianginal therapy (number of long-acting drugs): 1 Patient undergoing renal transplant: No Patient undergoing percutaneous valve procedure: No   1 Vessel Disease No proximal LAD involvement, No proximal left dominant LCX involvement  PCI: M (6);  Indication 2  CABG: M (4);  Indication 2 Proximal left dominant LCX involvement  PCI: A (7);  Indication 5  CABG: A (7);  Indication 5 Proximal LAD involvement  PCI: A (7);  Indication 5  CABG: A (7);  Indication 5  2 Vessel Disease No proximal LAD involvement  PCI: A (7);  Indication 8  CABG: M (6);  Indication 8 Proximal LAD involvement  PCI: A (7);  Indication 11  CABG: A (7);  Indication 11  3 Vessel Disease Low disease complexity (e.g., focal stenoses, SYNTAX <=22)  PCI: A (7);  Indication 17  CABG: A (8);  Indication 17 Intermediate or high disease complexity (e.g., SYNTAX >=23)  PCI: M (6);  Indication 21  CABG: A (8);  Indication 21  Left Main Disease Isolated LMCA disease: ostial or midshaft  PCI: A (7);  Indication 24  CABG: A (9);  Indication 24 Isolated LMCA  disease: bifurcation involvement  PCI: M (5);  Indication 25  CABG: A (9);  Indication 25 LMCA ostial or midshaft, concurrent low disease burden multivessel disease (e.g., 1-2 additional focal stenoses, SYNTAX <=22)  PCI: A (7);  Indication 26  CABG: A (9);  Indication 26 LMCA ostial or midshaft, concurrent intermediate or high disease burden multivessel disease (e.g., 1-2 additional bifurcation stenoses, long stenoses, SYNTAX >=23)  PCI: M (4);  Indication 27  CABG: A (9);  Indication 27 LMCA bifurcation involvement, concurrent low disease burden multivessel disease (e.g., 1-2 additional focal stenoses, SYNTAX <=22)  PCI: M (5);  Indication 28  CABG: A (9);  Indication 28 LMCA bifurcation involvement, concurrent intermediate or high disease burden multivessel disease (e.g., 1-2 additional bifurcation stenoses, long stenoses, SYNTAX >=23)  PCI: R (3);  Indication 29  CABG: A (9);  Indication 29  Notes:  A indicates appropriate. M indicates may be appropriate. R indicates rarely appropriate. Number in parentheses is median score for that indication. Reclassify indicates number of functionally diseased vessels should be decreased given negative FFR/iFR. Re-evaluate the scenario interpreting any FFR/iFR negative vessel as being not significantly stenosed.  Disease means involved vessel provides flow to a sufficient amount of myocardium to be clinically important.  If FFR testing indicates a vessel is not significant, that vessel should not be considered diseased (and the patient should be reclassified with respect to extent of functionally significant disease).  Proximal LAD + proximal left dominant LCX is considered 3 vessel CAD  2 Vessel CAD with FFR/iFR abnormal in  only 1 but not both is considered 1 vessel CAD  Disease complexity includes occlusion, bifurcation, trifurcation, ostial, >20 mm, tortuosity, calcification, thrombus  LMCA disease is >=50% by angiography, MLD <2.8 mm, MLA <6 mm2;  MLA 6-7.5 mm2 requires further physiologic  See Table B for risk stratification based on noninvasive testing  Journal of the [employer] of Cardiology Mar 2017, 23391; DOI: 10.1016/j.jacc.2017.02.001 [employer].de.[email or web address].full-text.pdf This App  2018 by the Society for Cardiovascular Angiography and Interventions   Adrian Prows

## 2017-07-08 NOTE — Discharge Instructions (Signed)

## 2017-07-12 DIAGNOSIS — Z79899 Other long term (current) drug therapy: Secondary | ICD-10-CM | POA: Diagnosis not present

## 2017-07-12 DIAGNOSIS — M0609 Rheumatoid arthritis without rheumatoid factor, multiple sites: Secondary | ICD-10-CM | POA: Diagnosis not present

## 2017-07-12 DIAGNOSIS — I10 Essential (primary) hypertension: Secondary | ICD-10-CM | POA: Diagnosis not present

## 2017-07-12 DIAGNOSIS — E039 Hypothyroidism, unspecified: Secondary | ICD-10-CM | POA: Diagnosis not present

## 2017-07-18 DIAGNOSIS — R5383 Other fatigue: Secondary | ICD-10-CM | POA: Diagnosis not present

## 2017-07-18 DIAGNOSIS — R5381 Other malaise: Secondary | ICD-10-CM | POA: Diagnosis not present

## 2017-07-18 DIAGNOSIS — R0602 Shortness of breath: Secondary | ICD-10-CM | POA: Diagnosis not present

## 2017-07-18 DIAGNOSIS — R9439 Abnormal result of other cardiovascular function study: Secondary | ICD-10-CM | POA: Diagnosis not present

## 2017-07-21 DIAGNOSIS — I10 Essential (primary) hypertension: Secondary | ICD-10-CM | POA: Diagnosis not present

## 2017-07-21 DIAGNOSIS — M0609 Rheumatoid arthritis without rheumatoid factor, multiple sites: Secondary | ICD-10-CM | POA: Diagnosis not present

## 2017-07-21 DIAGNOSIS — E039 Hypothyroidism, unspecified: Secondary | ICD-10-CM | POA: Diagnosis not present

## 2017-07-21 DIAGNOSIS — Z79899 Other long term (current) drug therapy: Secondary | ICD-10-CM | POA: Diagnosis not present

## 2017-09-06 DIAGNOSIS — E039 Hypothyroidism, unspecified: Secondary | ICD-10-CM | POA: Diagnosis not present

## 2017-09-06 DIAGNOSIS — I10 Essential (primary) hypertension: Secondary | ICD-10-CM | POA: Diagnosis not present

## 2017-09-06 DIAGNOSIS — M353 Polymyalgia rheumatica: Secondary | ICD-10-CM | POA: Diagnosis not present

## 2017-09-06 DIAGNOSIS — Z79899 Other long term (current) drug therapy: Secondary | ICD-10-CM | POA: Diagnosis not present

## 2017-09-23 DIAGNOSIS — Z79899 Other long term (current) drug therapy: Secondary | ICD-10-CM | POA: Diagnosis not present

## 2017-09-23 DIAGNOSIS — M0609 Rheumatoid arthritis without rheumatoid factor, multiple sites: Secondary | ICD-10-CM | POA: Diagnosis not present

## 2017-11-01 DIAGNOSIS — Z96641 Presence of right artificial hip joint: Secondary | ICD-10-CM | POA: Diagnosis not present

## 2017-11-01 DIAGNOSIS — E039 Hypothyroidism, unspecified: Secondary | ICD-10-CM | POA: Diagnosis not present

## 2017-11-01 DIAGNOSIS — M069 Rheumatoid arthritis, unspecified: Secondary | ICD-10-CM | POA: Diagnosis not present

## 2017-11-01 DIAGNOSIS — Z0000 Encounter for general adult medical examination without abnormal findings: Secondary | ICD-10-CM | POA: Diagnosis not present

## 2017-11-01 DIAGNOSIS — M21611 Bunion of right foot: Secondary | ICD-10-CM | POA: Diagnosis not present

## 2017-11-01 DIAGNOSIS — M720 Palmar fascial fibromatosis [Dupuytren]: Secondary | ICD-10-CM | POA: Diagnosis not present

## 2017-11-01 DIAGNOSIS — Z79899 Other long term (current) drug therapy: Secondary | ICD-10-CM | POA: Diagnosis not present

## 2017-11-01 DIAGNOSIS — I10 Essential (primary) hypertension: Secondary | ICD-10-CM | POA: Diagnosis not present

## 2017-11-01 DIAGNOSIS — M2042 Other hammer toe(s) (acquired), left foot: Secondary | ICD-10-CM | POA: Diagnosis not present

## 2017-11-01 DIAGNOSIS — D692 Other nonthrombocytopenic purpura: Secondary | ICD-10-CM | POA: Diagnosis not present

## 2017-11-01 DIAGNOSIS — E7800 Pure hypercholesterolemia, unspecified: Secondary | ICD-10-CM | POA: Diagnosis not present

## 2017-11-01 DIAGNOSIS — M2041 Other hammer toe(s) (acquired), right foot: Secondary | ICD-10-CM | POA: Diagnosis not present

## 2017-11-25 DIAGNOSIS — H1031 Unspecified acute conjunctivitis, right eye: Secondary | ICD-10-CM | POA: Diagnosis not present

## 2017-11-25 DIAGNOSIS — J3489 Other specified disorders of nose and nasal sinuses: Secondary | ICD-10-CM | POA: Diagnosis not present

## 2017-12-28 DIAGNOSIS — H35363 Drusen (degenerative) of macula, bilateral: Secondary | ICD-10-CM | POA: Diagnosis not present

## 2017-12-28 DIAGNOSIS — H04123 Dry eye syndrome of bilateral lacrimal glands: Secondary | ICD-10-CM | POA: Diagnosis not present

## 2017-12-28 DIAGNOSIS — Z961 Presence of intraocular lens: Secondary | ICD-10-CM | POA: Diagnosis not present

## 2017-12-29 DIAGNOSIS — M0609 Rheumatoid arthritis without rheumatoid factor, multiple sites: Secondary | ICD-10-CM | POA: Diagnosis not present

## 2017-12-29 DIAGNOSIS — Z79899 Other long term (current) drug therapy: Secondary | ICD-10-CM | POA: Diagnosis not present

## 2018-01-13 ENCOUNTER — Ambulatory Visit (INDEPENDENT_AMBULATORY_CARE_PROVIDER_SITE_OTHER): Payer: Medicare Other | Admitting: Podiatry

## 2018-01-13 ENCOUNTER — Ambulatory Visit (INDEPENDENT_AMBULATORY_CARE_PROVIDER_SITE_OTHER): Payer: Medicare Other

## 2018-01-13 ENCOUNTER — Encounter: Payer: Self-pay | Admitting: Podiatry

## 2018-01-13 DIAGNOSIS — L603 Nail dystrophy: Secondary | ICD-10-CM

## 2018-01-13 DIAGNOSIS — M779 Enthesopathy, unspecified: Secondary | ICD-10-CM | POA: Diagnosis not present

## 2018-01-13 DIAGNOSIS — M659 Synovitis and tenosynovitis, unspecified: Secondary | ICD-10-CM

## 2018-01-13 DIAGNOSIS — M21611 Bunion of right foot: Secondary | ICD-10-CM | POA: Diagnosis not present

## 2018-01-13 NOTE — Patient Instructions (Signed)

## 2018-01-13 NOTE — Progress Notes (Signed)
Subjective:  Patient ID: Tonya Robinson, female    DOB: 27-Jun-1941,  MRN: [id number]  Chief Complaint  Patient presents with  . Foot Pain    right foot dorsal, 4th toe, bunion medial side of great toe; pt stated, "main concern is 4th toenail on right foot grows up, its a hammer toe so hurts while walking; also have bunion thats been there for a while, getting painful; want to talk about swelling on top of foot also; been dealing with it for years"    77 y.o. female presents with thickening and pain to the right 4th toe. Pain when walking. Would like it removed. Also has ain at her R foot bunion. Hx of RA takes Humira and methotrexate.   Past Medical History:  Diagnosis Date  . Arthritis   . Fibromyalgia   . Hypertension   . Osteoporosis   . Polymyalgia rheumatica ([place])   . Thyroid disease   . Tissue necrosis with gangrene in peripheral vascular disease ([facility]) RIGHT HIP   Past Surgical History:  Procedure Laterality Date  . ABDOMINAL HYSTERECTOMY    . BREAST LUMPECTOMY Right   . KNEE CARTILAGE SURGERY Right   . RIGHT/LEFT HEART CATH AND CORONARY ANGIOGRAPHY N/A 07/08/2017   Procedure: RIGHT/LEFT HEART CATH AND CORONARY ANGIOGRAPHY;  Surgeon: Adrian Prows, MD;  Location: [facility];  Service: Cardiovascular;  Laterality: N/A;    Current Outpatient Medications:  .  Adalimumab (HUMIRA) 40 MG/0.4ML PSKT, Inject 40 mg into the skin every 14 (fourteen) days. On fridays, Disp: , Rfl:  .  ALPRAZolam (XANAX) 0.25 MG tablet, Take 1 tab tid prn anxiety and hs prn insomnia. (Patient taking differently: Take 0.25 mg by mouth daily as needed for anxiety. ), Disp: 60 tablet, Rfl: 0 .  amLODipine (NORVASC) 2.5 MG tablet, Take 2.5 mg by mouth daily., Disp: , Rfl:  .  aspirin 81 MG tablet, Take 81 mg by mouth daily., Disp: , Rfl:  .  Calcium Carbonate-Vitamin D (CALCIUM 600+D PO), Take 1 tablet by mouth 3 (three) times daily., Disp: , Rfl:  .  cholecalciferol (VITAMIN D) 1000 units tablet, Take  1,000 Units by mouth daily., Disp: , Rfl:  .  folic acid (FOLVITE) 1 MG tablet, Take 2 mg by mouth daily., Disp: , Rfl:  .  metoprolol succinate (TOPROL-XL) 25 MG 24 hr tablet, Take 12.5 mg by mouth daily. , Disp: , Rfl:  .  Omega-3 Fatty Acids (FISH OIL) 1200 MG CAPS, Take 1,200 mg by mouth daily., Disp: , Rfl:  .  predniSONE (DELTASONE) 1 MG tablet, Take 3 mg by mouth daily. , Disp: , Rfl:  .  Specialty Vitamins Products (BIOTIN PLUS KERATIN) 10000-100 MCG-MG TABS, Take 1 tablet by mouth 3 (three) times a week., Disp: , Rfl:  .  vitamin B-12 (CYANOCOBALAMIN) 1000 MCG tablet, Take 1,000 mcg by mouth daily., Disp: , Rfl:  .  White Petrolatum-Mineral Oil (SYSTANE NIGHTTIME) OINT, Apply 1 application to eye at bedtime., Disp: , Rfl:  .  Zinc 50 MG TABS, Take 50 mg by mouth daily., Disp: , Rfl:  .  erythromycin (ERY-TAB) 500 MG EC tablet, Take 1,000 mg by mouth See admin instructions. Take 1000 mg 1 hour prior to dental procedures, Disp: , Rfl:  .  FLUoxetine (PROZAC) 10 MG capsule, TAKE ONE CAPSULE BY MOUTH ONCE DAILY (Patient not taking: Reported on 01/13/2018), Disp: 90 capsule, Rfl: 0 .  ibuprofen (ADVIL,MOTRIN) 200 MG tablet, Take 400 mg by mouth 3 (three)  times daily., Disp: , Rfl:  .  levothyroxine (SYNTHROID, LEVOTHROID) 25 MCG tablet, Take 1 tablet (25 mcg total) by mouth daily before breakfast. (Patient not taking: Reported on 01/13/2018), Disp: 90 tablet, Rfl: 2 .  Magnesium 400 MG TABS, Take 400 mg by mouth daily., Disp: , Rfl:  .  methotrexate (RHEUMATREX) 2.5 MG tablet, Take 20 mg by mouth every Monday. Caution:Chemotherapy. Protect from light. Takes 8 tablets weekly , Disp: , Rfl:   Allergies  Allergen Reactions  . Penicillins Rash    Childhood allergy Has patient had a PCN reaction causing immediate rash, facial/tongue/throat swelling, SOB or lightheadedness with hypotension: Yes Has patient had a PCN reaction causing severe rash involving mucus membranes or skin necrosis: No Has  patient had a PCN reaction that required hospitalization: No Has patient had a PCN reaction occurring within the last 10 years: No If all of the above answers are "NO", then may proceed with Cephalosporin use.    Review of Systems Objective:  There were no vitals filed for this visit. General AA&O x3. Normal mood and affect.  Vascular Dorsalis pedis and posterior tibial pulses  present 2+ bilaterally  Capillary refill normal to all digits. Pedal hair growth normal.  Neurologic Epicritic sensation grossly present.  Dermatologic No open lesions. Interspaces clear of maceration. Nails well groomed and normal in appearance. Painful dystrophic nail at R 4th toe  Orthopedic: MMT 5/5 in dorsiflexion, plantarflexion, inversion, and eversion. Normal joint ROM without pain or crepitus. Pain to palpation about the 4th toenail HAV deformity R. [place].   Assessment & Plan:  Patient was evaluated and treated and all questions answered.  Dystrophic nail R 4th toe -Patient elects to proceed with toenail removal today -Ingrown nail excised. See procedure note. -Educated on post-procedure care including soaking. Written instructions provided. -Patient to follow up in 2 weeks for nail check.  Procedure: Excision of Ingrown Toenail Location: Right 4th toe  Anesthesia: Lidocaine 1% plain; 1.5 mL and Marcaine 0.5% plain; 1.5 mL, digital block. Skin Prep: Betadine. Dressing: Silvadene; telfa; dry, sterile, compression dressing. Technique: Following skin prep, the toe was exsanguinated and a tourniquet was secured at the base of the toe. The nail was freed, split with a nail splitter, and excised. Chemical matrixectomy was then performed with phenol and irrigated out with alcohol. The tourniquet was then removed and sterile dressing applied.  Disposition: Patient tolerated procedure well. Patient to return in 2 weeks for follow-up.   HAV deformity R -XR taken and reviewed. -Discussed possible  surgical intervention via 1st MPJ fusion. Patient cannot do at this time due to work status and inability to drive. Will defer surgical consideration.  Return in about 2 weeks (around 01/27/2018) for Nail Check, Right 4th toe.

## 2018-01-20 DIAGNOSIS — M8589 Other specified disorders of bone density and structure, multiple sites: Secondary | ICD-10-CM | POA: Diagnosis not present

## 2018-02-02 ENCOUNTER — Ambulatory Visit (INDEPENDENT_AMBULATORY_CARE_PROVIDER_SITE_OTHER): Payer: Medicare Other | Admitting: Podiatry

## 2018-02-02 ENCOUNTER — Encounter: Payer: Self-pay | Admitting: Podiatry

## 2018-02-02 DIAGNOSIS — B351 Tinea unguium: Secondary | ICD-10-CM | POA: Diagnosis not present

## 2018-02-02 DIAGNOSIS — M79676 Pain in unspecified toe(s): Secondary | ICD-10-CM

## 2018-02-02 NOTE — Progress Notes (Signed)
  Subjective:  Patient ID: Tonya Robinson, female    DOB: 10/11/1940,  MRN: [id number]  Chief Complaint  Patient presents with  . Nail Problem    right foot 4th toe follow up; pt stated, "doing good, just wanted it checked, dont know how it's supposed to look"   77 y.o. female returns for the above complaint. Doing great just wanted to get it looked at.  Objective:   General AA&O x3. Normal mood and affect.  Vascular Foot warm and well perfused with good capillary refill.  Neurologic Sensation grossly intact.  Dermatologic Nail avulsion site healing well without drainage or erythema. Nail bed with overlying soft crust. No signs of local infection.  Orthopedic: No tenderness to palpation of the toe.   Assessment & Plan:  Patient was evaluated and treated and all questions answered.  S/p Ingrown Toenail Excision, right 4th toe -Healing well without issue. -Discussed return precautions. -F/u PRN

## 2018-02-03 DIAGNOSIS — H903 Sensorineural hearing loss, bilateral: Secondary | ICD-10-CM | POA: Diagnosis not present

## 2018-02-03 DIAGNOSIS — T162XXA Foreign body in left ear, initial encounter: Secondary | ICD-10-CM | POA: Insufficient documentation

## 2018-02-03 DIAGNOSIS — Z974 Presence of external hearing-aid: Secondary | ICD-10-CM | POA: Diagnosis not present

## 2018-02-03 DIAGNOSIS — Z7289 Other problems related to lifestyle: Secondary | ICD-10-CM | POA: Diagnosis not present

## 2018-02-03 DIAGNOSIS — Z87891 Personal history of nicotine dependence: Secondary | ICD-10-CM | POA: Diagnosis not present

## 2018-02-03 DIAGNOSIS — H9190 Unspecified hearing loss, unspecified ear: Secondary | ICD-10-CM | POA: Insufficient documentation

## 2018-02-10 DIAGNOSIS — Z1231 Encounter for screening mammogram for malignant neoplasm of breast: Secondary | ICD-10-CM | POA: Diagnosis not present

## 2018-02-10 DIAGNOSIS — Z124 Encounter for screening for malignant neoplasm of cervix: Secondary | ICD-10-CM | POA: Diagnosis not present

## 2018-02-23 DIAGNOSIS — M0609 Rheumatoid arthritis without rheumatoid factor, multiple sites: Secondary | ICD-10-CM | POA: Diagnosis not present

## 2018-02-23 DIAGNOSIS — Z79899 Other long term (current) drug therapy: Secondary | ICD-10-CM | POA: Diagnosis not present

## 2018-03-10 DIAGNOSIS — Z23 Encounter for immunization: Secondary | ICD-10-CM | POA: Diagnosis not present

## 2018-03-10 DIAGNOSIS — E7800 Pure hypercholesterolemia, unspecified: Secondary | ICD-10-CM | POA: Diagnosis not present

## 2018-03-10 DIAGNOSIS — F439 Reaction to severe stress, unspecified: Secondary | ICD-10-CM | POA: Diagnosis not present

## 2018-03-10 DIAGNOSIS — E039 Hypothyroidism, unspecified: Secondary | ICD-10-CM | POA: Diagnosis not present

## 2018-03-10 DIAGNOSIS — I10 Essential (primary) hypertension: Secondary | ICD-10-CM | POA: Diagnosis not present

## 2018-03-10 DIAGNOSIS — M0609 Rheumatoid arthritis without rheumatoid factor, multiple sites: Secondary | ICD-10-CM | POA: Diagnosis not present

## 2018-03-13 DIAGNOSIS — W19XXXA Unspecified fall, initial encounter: Secondary | ICD-10-CM | POA: Diagnosis not present

## 2018-03-13 DIAGNOSIS — M25551 Pain in right hip: Secondary | ICD-10-CM | POA: Diagnosis not present

## 2018-03-22 DIAGNOSIS — Z1212 Encounter for screening for malignant neoplasm of rectum: Secondary | ICD-10-CM | POA: Diagnosis not present

## 2018-03-22 DIAGNOSIS — Z1211 Encounter for screening for malignant neoplasm of colon: Secondary | ICD-10-CM | POA: Diagnosis not present

## 2018-04-10 DIAGNOSIS — E039 Hypothyroidism, unspecified: Secondary | ICD-10-CM | POA: Diagnosis not present

## 2018-04-10 DIAGNOSIS — F439 Reaction to severe stress, unspecified: Secondary | ICD-10-CM | POA: Diagnosis not present

## 2018-04-10 DIAGNOSIS — I10 Essential (primary) hypertension: Secondary | ICD-10-CM | POA: Diagnosis not present

## 2018-04-10 DIAGNOSIS — M0609 Rheumatoid arthritis without rheumatoid factor, multiple sites: Secondary | ICD-10-CM | POA: Diagnosis not present

## 2018-04-18 DIAGNOSIS — Z79899 Other long term (current) drug therapy: Secondary | ICD-10-CM | POA: Diagnosis not present

## 2018-05-02 ENCOUNTER — Ambulatory Visit (INDEPENDENT_AMBULATORY_CARE_PROVIDER_SITE_OTHER): Payer: Medicare Other | Admitting: Psychology

## 2018-05-02 DIAGNOSIS — F411 Generalized anxiety disorder: Secondary | ICD-10-CM

## 2018-05-16 ENCOUNTER — Ambulatory Visit (INDEPENDENT_AMBULATORY_CARE_PROVIDER_SITE_OTHER): Payer: Medicare Other | Admitting: Psychology

## 2018-05-16 DIAGNOSIS — F411 Generalized anxiety disorder: Secondary | ICD-10-CM | POA: Diagnosis not present

## 2018-05-30 ENCOUNTER — Ambulatory Visit: Payer: PRIVATE HEALTH INSURANCE | Admitting: Psychology

## 2018-06-19 DIAGNOSIS — M0609 Rheumatoid arthritis without rheumatoid factor, multiple sites: Secondary | ICD-10-CM | POA: Diagnosis not present

## 2018-06-19 DIAGNOSIS — I10 Essential (primary) hypertension: Secondary | ICD-10-CM | POA: Diagnosis not present

## 2018-06-19 DIAGNOSIS — R8290 Unspecified abnormal findings in urine: Secondary | ICD-10-CM | POA: Diagnosis not present

## 2018-06-19 DIAGNOSIS — E039 Hypothyroidism, unspecified: Secondary | ICD-10-CM | POA: Diagnosis not present

## 2018-06-19 DIAGNOSIS — F439 Reaction to severe stress, unspecified: Secondary | ICD-10-CM | POA: Diagnosis not present

## 2018-06-19 DIAGNOSIS — H1031 Unspecified acute conjunctivitis, right eye: Secondary | ICD-10-CM | POA: Diagnosis not present

## 2018-06-19 DIAGNOSIS — Z79899 Other long term (current) drug therapy: Secondary | ICD-10-CM | POA: Diagnosis not present

## 2018-06-23 DIAGNOSIS — Z79899 Other long term (current) drug therapy: Secondary | ICD-10-CM | POA: Diagnosis not present

## 2018-06-23 DIAGNOSIS — M19049 Primary osteoarthritis, unspecified hand: Secondary | ICD-10-CM | POA: Diagnosis not present

## 2018-06-23 DIAGNOSIS — M0609 Rheumatoid arthritis without rheumatoid factor, multiple sites: Secondary | ICD-10-CM | POA: Diagnosis not present

## 2018-07-24 ENCOUNTER — Ambulatory Visit (INDEPENDENT_AMBULATORY_CARE_PROVIDER_SITE_OTHER): Payer: Medicare Other | Admitting: Psychology

## 2018-07-24 DIAGNOSIS — B029 Zoster without complications: Secondary | ICD-10-CM | POA: Diagnosis not present

## 2018-07-24 DIAGNOSIS — R109 Unspecified abdominal pain: Secondary | ICD-10-CM | POA: Diagnosis not present

## 2018-07-24 DIAGNOSIS — F411 Generalized anxiety disorder: Secondary | ICD-10-CM | POA: Diagnosis not present

## 2018-07-31 DIAGNOSIS — M0609 Rheumatoid arthritis without rheumatoid factor, multiple sites: Secondary | ICD-10-CM | POA: Diagnosis not present

## 2018-07-31 DIAGNOSIS — B0229 Other postherpetic nervous system involvement: Secondary | ICD-10-CM | POA: Diagnosis not present

## 2018-07-31 DIAGNOSIS — B028 Zoster with other complications: Secondary | ICD-10-CM | POA: Diagnosis not present

## 2018-07-31 DIAGNOSIS — M25551 Pain in right hip: Secondary | ICD-10-CM | POA: Diagnosis not present

## 2018-08-07 DIAGNOSIS — R350 Frequency of micturition: Secondary | ICD-10-CM | POA: Diagnosis not present

## 2018-08-07 DIAGNOSIS — B028 Zoster with other complications: Secondary | ICD-10-CM | POA: Diagnosis not present

## 2018-08-07 DIAGNOSIS — M25551 Pain in right hip: Secondary | ICD-10-CM | POA: Diagnosis not present

## 2018-08-07 DIAGNOSIS — B0229 Other postherpetic nervous system involvement: Secondary | ICD-10-CM | POA: Diagnosis not present

## 2018-08-07 DIAGNOSIS — M0609 Rheumatoid arthritis without rheumatoid factor, multiple sites: Secondary | ICD-10-CM | POA: Diagnosis not present

## 2018-08-09 ENCOUNTER — Other Ambulatory Visit: Payer: Self-pay | Admitting: Family Medicine

## 2018-08-09 DIAGNOSIS — M25551 Pain in right hip: Secondary | ICD-10-CM

## 2018-08-11 ENCOUNTER — Ambulatory Visit
Admission: RE | Admit: 2018-08-11 | Discharge: 2018-08-11 | Disposition: A | Discharge status: Home / Self Care | Payer: Medicare Other | Source: Ambulatory Visit | Attending: Family Medicine | Admitting: Family Medicine

## 2018-08-11 DIAGNOSIS — M06851 Other specified rheumatoid arthritis, right hip: Secondary | ICD-10-CM | POA: Diagnosis not present

## 2018-08-11 DIAGNOSIS — M25551 Pain in right hip: Secondary | ICD-10-CM

## 2018-08-11 IMAGING — CT CT HIP*R* W/O CM
1 series · 15 of 32 positions shown, 19 images · non-contrast
Comparison: [DATE]

CLINICAL DATA: Right hip pain.  Rheumatoid arthritis.

EXAM:
CT OF THE RIGHT HIP WITHOUT CONTRAST
TECHNIQUE: Multidetector CT imaging of the right hip was performed according to
the standard protocol. Multiplanar CT image reconstructions were
also generated.

[Series 4: soft tissue pelvis/hip · axial · 0.39mm/px · z∈[-356,-80]mm · 15 of 103 slices shown, 19 images]
[im 7/103  soft-tissue]
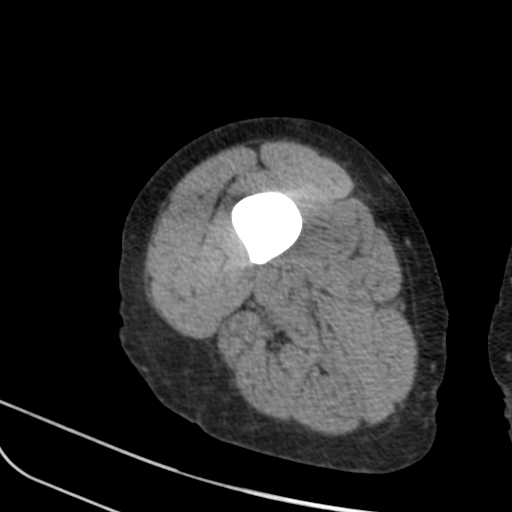
[im 7/103  bone]
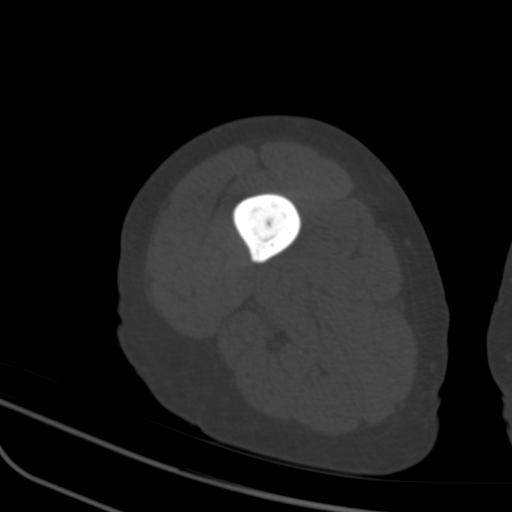
[im 14/103  soft-tissue]
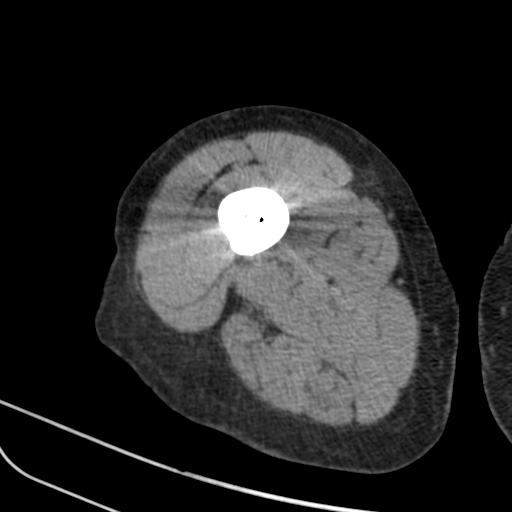
[im 20/103  soft-tissue]
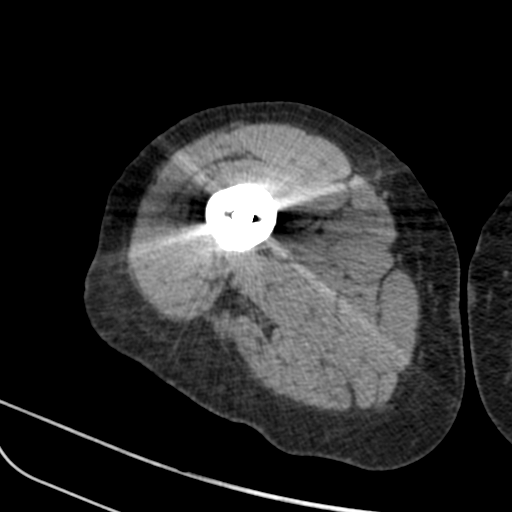
[im 30/103  soft-tissue]
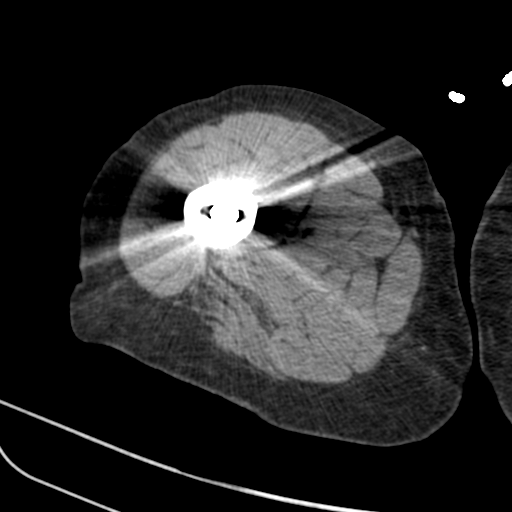
[im 37/103  soft-tissue]
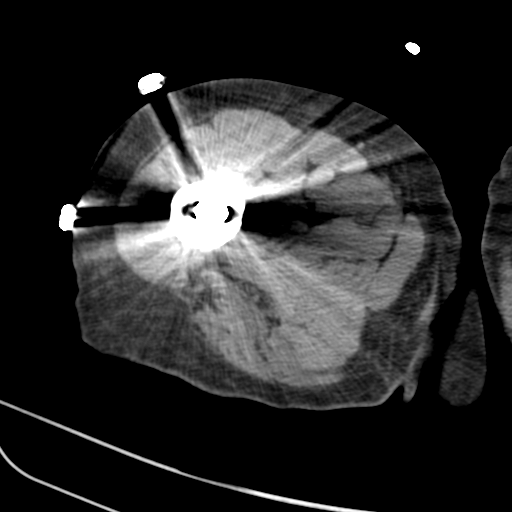
[im 43/103  soft-tissue]
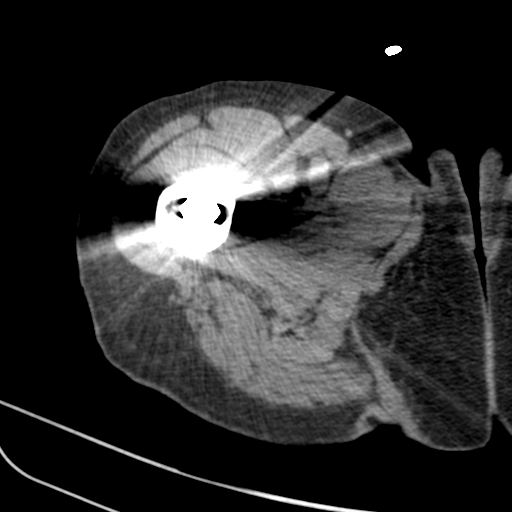
[im 53/103  soft-tissue]
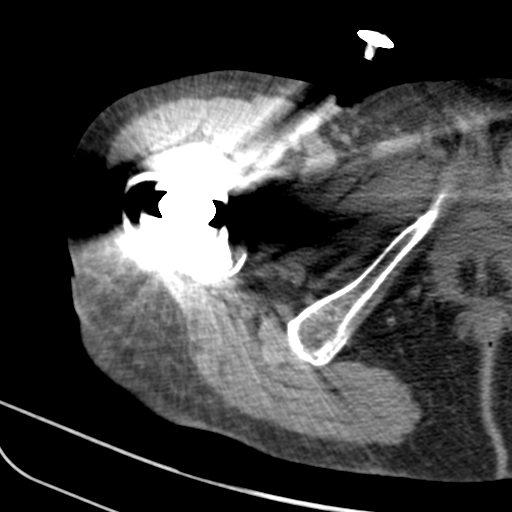
[im 60/103  soft-tissue]
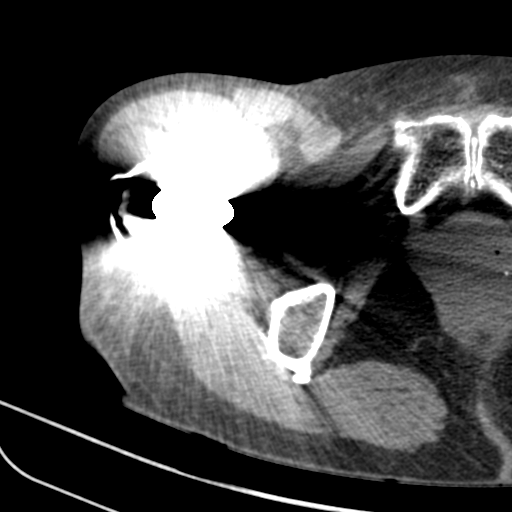
[im 66/103  soft-tissue]
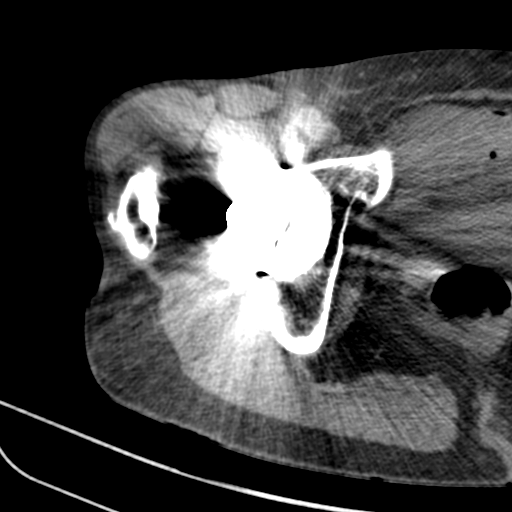
[im 66/103  bone]
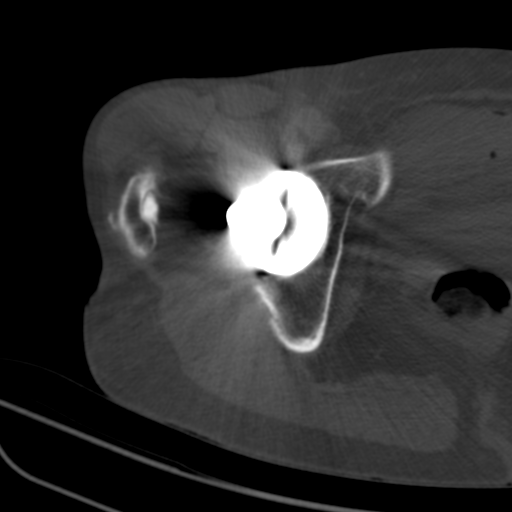
[im 73/103  soft-tissue]
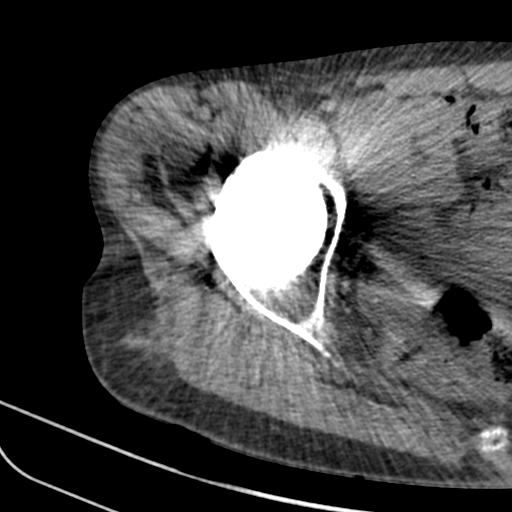
[im 83/103  soft-tissue]
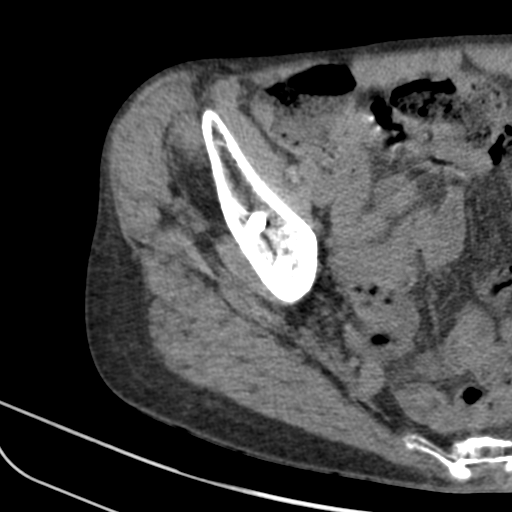
[im 89/103  soft-tissue]
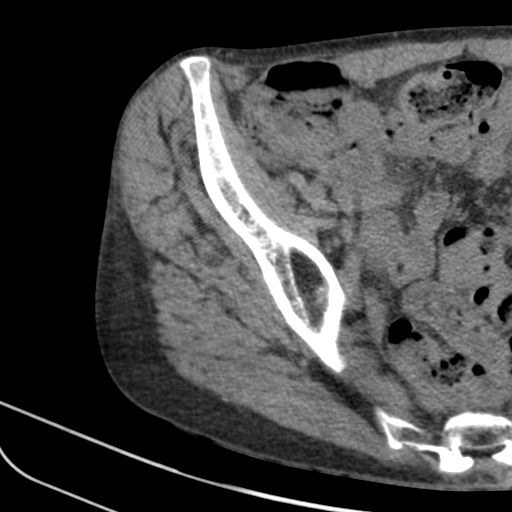
[im 89/103  lung]
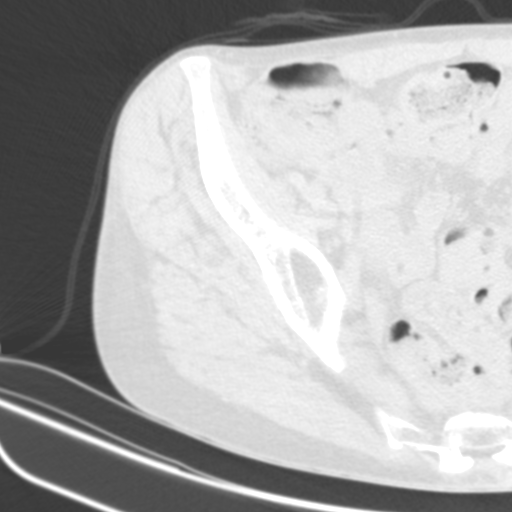
[im 93/103  lung]
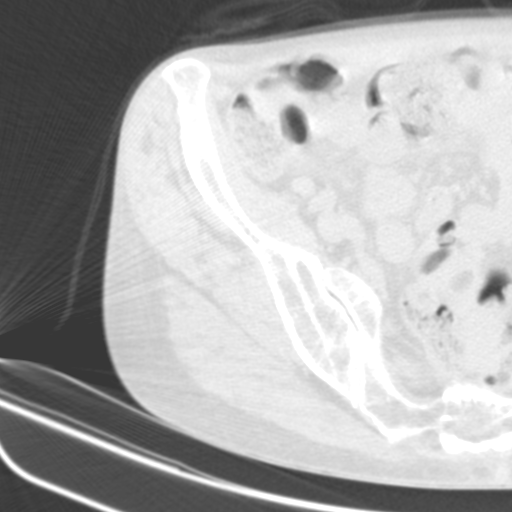
[im 96/103  soft-tissue]
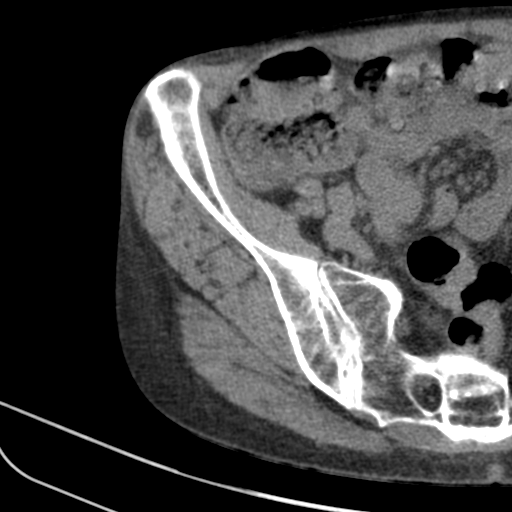
[im 96/103  lung]
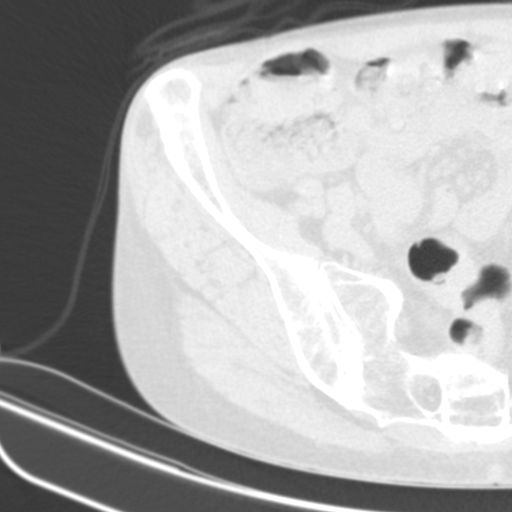
[im 99/103  lung]
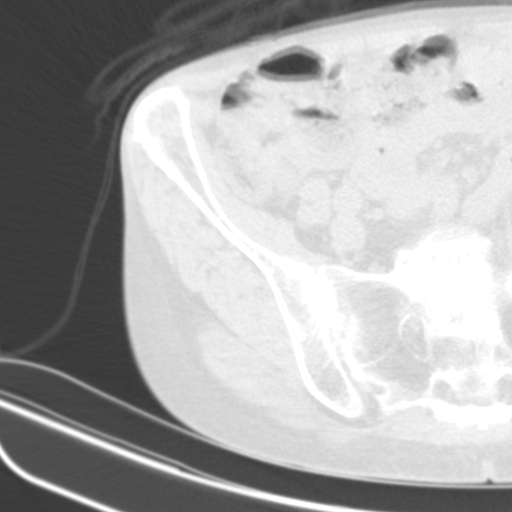

[15 of 32 positions shown; findings below may reference images not displayed]

FINDINGS: Bones/Joint/Cartilage

Right total hip prosthesis in place. Small chronic linear
calcifications suggesting chronic partial avulsion along the lesser
trochanter the iloipsoas attachment, but this has been present
chronically for example on [DATE]. No lucency around the
acetabular shell fixation screws. No abnormal lucency at the
metal-methacrylate or methacrylate-bone interfaces.

No periprosthetic fracture is observed. Mild chondrocalcinosis of
the pubic symphysis. No significant bony erosions surround the
prosthesis.

Ligaments

Suboptimally assessed by CT.

Muscles and Tendons

Gluteus minimus atrophy, not uncommon in this age group. Streak
artifact mildly adversely affects musculotendinous assessment.
Scattered calcifications along the gluteus medius musculotendinous
junction possibly some thickening of the distal gluteus medius
tendon.

Soft tissues

No abnormal regional fluid collection is identified. No lesion along
the superficial fascia margin is noted.
IMPRESSION: 1. A specific cause for the patient's right hip pain is not
identified. No spur complicating feature of the prosthesis is
observed, and no appreciable regional fracture is identified.
2. There is some thickening and calcification along the gluteus
medius tendon probably from tendinopathy.
3. Chronic linear calcification along the distal iliopsoas tendon
margin, probably from a chronic avulsion but unchanged from at least

## 2018-08-15 ENCOUNTER — Ambulatory Visit (INDEPENDENT_AMBULATORY_CARE_PROVIDER_SITE_OTHER): Payer: Medicare Other | Admitting: Psychology

## 2018-08-15 DIAGNOSIS — F411 Generalized anxiety disorder: Secondary | ICD-10-CM | POA: Diagnosis not present

## 2018-08-15 DIAGNOSIS — B028 Zoster with other complications: Secondary | ICD-10-CM | POA: Diagnosis not present

## 2018-08-15 DIAGNOSIS — B0229 Other postherpetic nervous system involvement: Secondary | ICD-10-CM | POA: Diagnosis not present

## 2018-08-18 DIAGNOSIS — Z79899 Other long term (current) drug therapy: Secondary | ICD-10-CM | POA: Diagnosis not present

## 2018-08-29 DIAGNOSIS — B028 Zoster with other complications: Secondary | ICD-10-CM | POA: Diagnosis not present

## 2018-08-29 DIAGNOSIS — R682 Dry mouth, unspecified: Secondary | ICD-10-CM | POA: Diagnosis not present

## 2018-08-29 DIAGNOSIS — M25551 Pain in right hip: Secondary | ICD-10-CM | POA: Diagnosis not present

## 2018-08-29 DIAGNOSIS — M0609 Rheumatoid arthritis without rheumatoid factor, multiple sites: Secondary | ICD-10-CM | POA: Diagnosis not present

## 2018-08-29 DIAGNOSIS — B0229 Other postherpetic nervous system involvement: Secondary | ICD-10-CM | POA: Diagnosis not present

## 2018-09-05 ENCOUNTER — Ambulatory Visit (INDEPENDENT_AMBULATORY_CARE_PROVIDER_SITE_OTHER): Payer: Medicare Other | Admitting: Psychology

## 2018-09-05 DIAGNOSIS — F411 Generalized anxiety disorder: Secondary | ICD-10-CM | POA: Diagnosis not present

## 2018-09-19 DIAGNOSIS — H26491 Other secondary cataract, right eye: Secondary | ICD-10-CM | POA: Diagnosis not present

## 2018-09-19 DIAGNOSIS — H26492 Other secondary cataract, left eye: Secondary | ICD-10-CM | POA: Diagnosis not present

## 2018-09-25 ENCOUNTER — Ambulatory Visit (INDEPENDENT_AMBULATORY_CARE_PROVIDER_SITE_OTHER): Payer: Medicare Other | Admitting: Psychology

## 2018-09-25 DIAGNOSIS — F411 Generalized anxiety disorder: Secondary | ICD-10-CM

## 2018-10-13 DIAGNOSIS — Z79899 Other long term (current) drug therapy: Secondary | ICD-10-CM | POA: Diagnosis not present

## 2018-10-30 ENCOUNTER — Ambulatory Visit (INDEPENDENT_AMBULATORY_CARE_PROVIDER_SITE_OTHER): Payer: Medicare Other | Admitting: Psychology

## 2018-10-30 DIAGNOSIS — F411 Generalized anxiety disorder: Secondary | ICD-10-CM | POA: Diagnosis not present

## 2018-11-20 ENCOUNTER — Ambulatory Visit (INDEPENDENT_AMBULATORY_CARE_PROVIDER_SITE_OTHER): Payer: Medicare Other | Admitting: Psychology

## 2018-11-20 DIAGNOSIS — F411 Generalized anxiety disorder: Secondary | ICD-10-CM

## 2018-11-23 DIAGNOSIS — F419 Anxiety disorder, unspecified: Secondary | ICD-10-CM | POA: Diagnosis not present

## 2018-11-23 DIAGNOSIS — R21 Rash and other nonspecific skin eruption: Secondary | ICD-10-CM | POA: Diagnosis not present

## 2018-11-28 DIAGNOSIS — Z961 Presence of intraocular lens: Secondary | ICD-10-CM | POA: Diagnosis not present

## 2018-11-28 DIAGNOSIS — H26491 Other secondary cataract, right eye: Secondary | ICD-10-CM | POA: Diagnosis not present

## 2018-11-28 DIAGNOSIS — H18413 Arcus senilis, bilateral: Secondary | ICD-10-CM | POA: Diagnosis not present

## 2018-11-28 DIAGNOSIS — H26492 Other secondary cataract, left eye: Secondary | ICD-10-CM | POA: Diagnosis not present

## 2018-12-01 DIAGNOSIS — I10 Essential (primary) hypertension: Secondary | ICD-10-CM | POA: Diagnosis not present

## 2018-12-01 DIAGNOSIS — Z0000 Encounter for general adult medical examination without abnormal findings: Secondary | ICD-10-CM | POA: Diagnosis not present

## 2018-12-01 DIAGNOSIS — R21 Rash and other nonspecific skin eruption: Secondary | ICD-10-CM | POA: Diagnosis not present

## 2018-12-01 DIAGNOSIS — R143 Flatulence: Secondary | ICD-10-CM | POA: Diagnosis not present

## 2018-12-01 DIAGNOSIS — R195 Other fecal abnormalities: Secondary | ICD-10-CM | POA: Diagnosis not present

## 2018-12-01 DIAGNOSIS — I8390 Asymptomatic varicose veins of unspecified lower extremity: Secondary | ICD-10-CM | POA: Diagnosis not present

## 2018-12-01 DIAGNOSIS — E039 Hypothyroidism, unspecified: Secondary | ICD-10-CM | POA: Diagnosis not present

## 2018-12-01 DIAGNOSIS — M0609 Rheumatoid arthritis without rheumatoid factor, multiple sites: Secondary | ICD-10-CM | POA: Diagnosis not present

## 2018-12-01 DIAGNOSIS — Z79899 Other long term (current) drug therapy: Secondary | ICD-10-CM | POA: Diagnosis not present

## 2018-12-01 DIAGNOSIS — M25551 Pain in right hip: Secondary | ICD-10-CM | POA: Diagnosis not present

## 2018-12-08 DIAGNOSIS — Z1211 Encounter for screening for malignant neoplasm of colon: Secondary | ICD-10-CM | POA: Diagnosis not present

## 2018-12-08 DIAGNOSIS — R143 Flatulence: Secondary | ICD-10-CM | POA: Diagnosis not present

## 2018-12-08 DIAGNOSIS — E035 Myxedema coma: Secondary | ICD-10-CM | POA: Diagnosis not present

## 2018-12-08 DIAGNOSIS — Z79899 Other long term (current) drug therapy: Secondary | ICD-10-CM | POA: Diagnosis not present

## 2018-12-08 DIAGNOSIS — R198 Other specified symptoms and signs involving the digestive system and abdomen: Secondary | ICD-10-CM | POA: Diagnosis not present

## 2018-12-08 DIAGNOSIS — M0609 Rheumatoid arthritis without rheumatoid factor, multiple sites: Secondary | ICD-10-CM | POA: Diagnosis not present

## 2018-12-12 DIAGNOSIS — Z1211 Encounter for screening for malignant neoplasm of colon: Secondary | ICD-10-CM | POA: Diagnosis not present

## 2018-12-12 DIAGNOSIS — H26491 Other secondary cataract, right eye: Secondary | ICD-10-CM | POA: Diagnosis not present

## 2018-12-24 ENCOUNTER — Other Ambulatory Visit: Payer: Self-pay | Admitting: Cardiology

## 2018-12-25 ENCOUNTER — Ambulatory Visit (INDEPENDENT_AMBULATORY_CARE_PROVIDER_SITE_OTHER): Payer: Medicare Other | Admitting: Psychology

## 2018-12-25 DIAGNOSIS — F411 Generalized anxiety disorder: Secondary | ICD-10-CM

## 2019-01-01 DIAGNOSIS — M0609 Rheumatoid arthritis without rheumatoid factor, multiple sites: Secondary | ICD-10-CM | POA: Diagnosis not present

## 2019-01-01 DIAGNOSIS — Z79899 Other long term (current) drug therapy: Secondary | ICD-10-CM | POA: Diagnosis not present

## 2019-01-12 DIAGNOSIS — Z86010 Personal history of colonic polyps: Secondary | ICD-10-CM | POA: Diagnosis not present

## 2019-01-12 DIAGNOSIS — R143 Flatulence: Secondary | ICD-10-CM | POA: Diagnosis not present

## 2019-01-12 DIAGNOSIS — R195 Other fecal abnormalities: Secondary | ICD-10-CM | POA: Diagnosis not present

## 2019-01-15 DIAGNOSIS — H16143 Punctate keratitis, bilateral: Secondary | ICD-10-CM | POA: Diagnosis not present

## 2019-01-22 ENCOUNTER — Ambulatory Visit (INDEPENDENT_AMBULATORY_CARE_PROVIDER_SITE_OTHER): Payer: Medicare Other | Admitting: Psychology

## 2019-01-22 DIAGNOSIS — F411 Generalized anxiety disorder: Secondary | ICD-10-CM | POA: Diagnosis not present

## 2019-02-02 DIAGNOSIS — M0609 Rheumatoid arthritis without rheumatoid factor, multiple sites: Secondary | ICD-10-CM | POA: Diagnosis not present

## 2019-02-02 DIAGNOSIS — Z79899 Other long term (current) drug therapy: Secondary | ICD-10-CM | POA: Diagnosis not present

## 2019-02-20 ENCOUNTER — Other Ambulatory Visit: Payer: Self-pay

## 2019-02-21 ENCOUNTER — Other Ambulatory Visit: Payer: Self-pay

## 2019-03-06 ENCOUNTER — Ambulatory Visit (INDEPENDENT_AMBULATORY_CARE_PROVIDER_SITE_OTHER): Payer: Medicare Other | Admitting: Psychology

## 2019-03-06 DIAGNOSIS — F411 Generalized anxiety disorder: Secondary | ICD-10-CM

## 2019-03-30 DIAGNOSIS — Z79899 Other long term (current) drug therapy: Secondary | ICD-10-CM | POA: Diagnosis not present

## 2019-04-02 DIAGNOSIS — R198 Other specified symptoms and signs involving the digestive system and abdomen: Secondary | ICD-10-CM | POA: Diagnosis not present

## 2019-04-02 DIAGNOSIS — Z86010 Personal history of colonic polyps: Secondary | ICD-10-CM | POA: Diagnosis not present

## 2019-04-02 DIAGNOSIS — R143 Flatulence: Secondary | ICD-10-CM | POA: Diagnosis not present

## 2019-04-03 DIAGNOSIS — E039 Hypothyroidism, unspecified: Secondary | ICD-10-CM | POA: Diagnosis not present

## 2019-04-03 DIAGNOSIS — R600 Localized edema: Secondary | ICD-10-CM | POA: Diagnosis not present

## 2019-04-03 DIAGNOSIS — I7389 Other specified peripheral vascular diseases: Secondary | ICD-10-CM | POA: Diagnosis not present

## 2019-04-03 DIAGNOSIS — F419 Anxiety disorder, unspecified: Secondary | ICD-10-CM | POA: Diagnosis not present

## 2019-04-03 DIAGNOSIS — M069 Rheumatoid arthritis, unspecified: Secondary | ICD-10-CM | POA: Diagnosis not present

## 2019-04-03 DIAGNOSIS — I10 Essential (primary) hypertension: Secondary | ICD-10-CM | POA: Diagnosis not present

## 2019-04-03 DIAGNOSIS — M2042 Other hammer toe(s) (acquired), left foot: Secondary | ICD-10-CM | POA: Diagnosis not present

## 2019-04-03 DIAGNOSIS — Z23 Encounter for immunization: Secondary | ICD-10-CM | POA: Diagnosis not present

## 2019-04-03 DIAGNOSIS — E7800 Pure hypercholesterolemia, unspecified: Secondary | ICD-10-CM | POA: Diagnosis not present

## 2019-04-03 DIAGNOSIS — M8580 Other specified disorders of bone density and structure, unspecified site: Secondary | ICD-10-CM | POA: Diagnosis not present

## 2019-04-03 DIAGNOSIS — M21611 Bunion of right foot: Secondary | ICD-10-CM | POA: Diagnosis not present

## 2019-04-06 ENCOUNTER — Ambulatory Visit (INDEPENDENT_AMBULATORY_CARE_PROVIDER_SITE_OTHER): Payer: Medicare Other | Admitting: Podiatry

## 2019-04-06 ENCOUNTER — Other Ambulatory Visit: Payer: Self-pay

## 2019-04-06 ENCOUNTER — Other Ambulatory Visit: Payer: Self-pay | Admitting: Podiatry

## 2019-04-06 ENCOUNTER — Ambulatory Visit (INDEPENDENT_AMBULATORY_CARE_PROVIDER_SITE_OTHER): Payer: Medicare Other

## 2019-04-06 DIAGNOSIS — M2041 Other hammer toe(s) (acquired), right foot: Secondary | ICD-10-CM

## 2019-04-06 DIAGNOSIS — M21611 Bunion of right foot: Secondary | ICD-10-CM

## 2019-04-06 DIAGNOSIS — M25571 Pain in right ankle and joints of right foot: Secondary | ICD-10-CM | POA: Diagnosis not present

## 2019-04-06 DIAGNOSIS — M79671 Pain in right foot: Secondary | ICD-10-CM

## 2019-04-16 ENCOUNTER — Ambulatory Visit (INDEPENDENT_AMBULATORY_CARE_PROVIDER_SITE_OTHER): Payer: Medicare Other | Admitting: Psychology

## 2019-04-16 DIAGNOSIS — F411 Generalized anxiety disorder: Secondary | ICD-10-CM

## 2019-05-03 ENCOUNTER — Ambulatory Visit: Payer: Medicare Other | Admitting: Podiatry

## 2019-05-05 NOTE — Progress Notes (Signed)
Subjective:  Patient ID: Tonya Robinson, female    DOB: August 29, 1940,  MRN: [id number]  Chief Complaint  Patient presents with  . Hammer Toe    Pt states Right foot 3rd and 4th digit hammertoes  . Foot Problem    Pt states Right 1st digit is pointing upwards  . Bunions    Pt states right foot bunion  . Foot Pain    Pt states dorsal right foot pain, 6 months duration, no known injury.  . Ankle Pain    Pt states lateral ankle pain of right foot, 6 months duration, no known injury.    78 y.o. female presents with the above complaint.    Review of Systems: Negative except as noted in the HPI. Denies N/V/F/Ch.  Past Medical History:  Diagnosis Date  . Arthritis   . Fibromyalgia   . Hypertension   . Osteoporosis   . Polymyalgia rheumatica ([place])   . Thyroid disease   . Tissue necrosis with gangrene in peripheral vascular disease ([place]) RIGHT HIP    Current Outpatient Medications:  .  Adalimumab (HUMIRA) 40 MG/0.4ML PSKT, Inject 40 mg into the skin every 14 (fourteen) days. On fridays, Disp: , Rfl:  .  ALPRAZolam (XANAX) 0.25 MG tablet, Take 1 tab tid prn anxiety and hs prn insomnia. (Patient taking differently: Take 0.25 mg by mouth daily as needed for anxiety. ), Disp: 60 tablet, Rfl: 0 .  amLODipine (NORVASC) 2.5 MG tablet, Take 2.5 mg by mouth daily., Disp: , Rfl:  .  ascorbic acid (VITAMIN C) 1000 MG tablet, Take by mouth., Disp: , Rfl:  .  aspirin 81 MG tablet, Take 81 mg by mouth daily., Disp: , Rfl:  .  azithromycin (ZITHROMAX) 250 MG tablet, TAKE 2 TABLETS BY MOUTH TODAY, THEN TAKE 1 TABLET DAILY FOR 4 DAYS, Disp: , Rfl:  .  Calcium Carbonate-Vitamin D (CALCIUM 600+D PO), Take 1 tablet by mouth 3 (three) times daily., Disp: , Rfl:  .  cholecalciferol (VITAMIN D) 1000 units tablet, Take 1,000 Units by mouth daily., Disp: , Rfl:  .  erythromycin (ERY-TAB) 500 MG EC tablet, Take 1,000 mg by mouth See admin instructions. Take 1000 mg 1 hour prior to dental procedures, Disp: , Rfl:   .  FLUoxetine (PROZAC) 10 MG capsule, TAKE ONE CAPSULE BY MOUTH ONCE DAILY, Disp: 90 capsule, Rfl: 0 .  folic acid (FOLVITE) 1 MG tablet, Take 2 mg by mouth daily., Disp: , Rfl:  .  ibuprofen (ADVIL,MOTRIN) 200 MG tablet, Take 400 mg by mouth 3 (three) times daily., Disp: , Rfl:  .  levothyroxine (SYNTHROID, LEVOTHROID) 25 MCG tablet, Take 1 tablet (25 mcg total) by mouth daily before breakfast., Disp: 90 tablet, Rfl: 2 .  Magnesium 400 MG TABS, Take 400 mg by mouth daily., Disp: , Rfl:  .  methotrexate (RHEUMATREX) 2.5 MG tablet, Take 20 mg by mouth every Monday. Caution:Chemotherapy. Protect from light. Takes 8 tablets weekly , Disp: , Rfl:  .  metoprolol succinate (TOPROL-XL) 25 MG 24 hr tablet, Take 12.5 mg by mouth daily. , Disp: , Rfl:  .  Omega-3 Fatty Acids (FISH OIL) 1200 MG CAPS, Take 1,200 mg by mouth daily., Disp: , Rfl:  .  predniSONE (DELTASONE) 1 MG tablet, Take 3 mg by mouth daily. , Disp: , Rfl:  .  silver sulfADIAZINE (SILVADENE) 1 % cream, Apply pea-sized amount to wound daily., Disp: 50 g, Rfl: 0 .  Specialty Vitamins Products (BIOTIN PLUS KERATIN) 10000-100 MCG-MG TABS,  Take 1 tablet by mouth 3 (three) times a week., Disp: , Rfl:  .  tobramycin-dexamethasone (TOBRADEX) ophthalmic solution, , Disp: , Rfl:  .  vitamin B-12 (CYANOCOBALAMIN) 1000 MCG tablet, Take 1,000 mcg by mouth daily., Disp: , Rfl:  .  White Petrolatum-Mineral Oil (SYSTANE NIGHTTIME) OINT, Apply 1 application to eye at bedtime., Disp: , Rfl:  .  Zinc 50 MG TABS, Take 50 mg by mouth daily., Disp: , Rfl:   Social History   Tobacco Use  Smoking Status Former Smoker  . Packs/day: 0.25  . Types: Cigarettes  Smokeless Tobacco Never Used    Allergies  Allergen Reactions  . Valtrex [Valacyclovir]   . Penicillins Rash    Childhood allergy Has patient had a PCN reaction causing immediate rash, facial/tongue/throat swelling, SOB or lightheadedness with hypotension: Yes Has patient had a PCN reaction causing  severe rash involving mucus membranes or skin necrosis: No Has patient had a PCN reaction that required hospitalization: No Has patient had a PCN reaction occurring within the last 10 years: No If all of the above answers are "NO", then may proceed with Cephalosporin use.    Objective:  There were no vitals filed for this visit. There is no height or weight on file to calculate BMI. Constitutional Well developed. Well nourished.  Vascular Dorsalis pedis pulses palpable bilaterally. Posterior tibial pulses palpable bilaterally. Capillary refill normal to all digits.  No cyanosis or clubbing noted. Pedal hair growth normal.  Neurologic Normal speech. Oriented to person, place, and time. Epicritic sensation to light touch grossly present bilaterally.  Dermatologic Nails well groomed and normal in appearance. No open wounds. No skin lesions.  Orthopedic: POP sinus tarsi Hammertoes right 3rd/4th toes HAV right   Radiographs: Taken and revewed HAV deformity hammertoe formation subtalar sclerosis Assessment:   1. Sinus tarsi syndrome of right ankle   2. Hammertoe of right foot    Plan:  Patient was evaluated and treated and all questions answered.  Pes planus with sinus tarsitis, Right -X-rays reviewed as above -Injection delivered to the sinus tarsi as below -Dispensed Tri-Lock ankle brace.  Patient educated on use  Procedure: Injection Intermediate Joint Consent: Verbal consent obtained. Location: Right sinus tarsi. Skin Prep: alcohol. Injectate: 1 cc 0.5% marcaine plain, 1 cc dexamethasone phosphate, 0.5 cc kenalog 10. Disposition: Patient tolerated procedure well. Injection site dressed with a band-aid.  HAV, Hammertoes -Discussed Toe spacers. Dispesned  Return in about 3 weeks (around 04/27/2019) for Sinus tarsitis right .

## 2019-05-11 DIAGNOSIS — R350 Frequency of micturition: Secondary | ICD-10-CM | POA: Diagnosis not present

## 2019-05-11 DIAGNOSIS — Z124 Encounter for screening for malignant neoplasm of cervix: Secondary | ICD-10-CM | POA: Diagnosis not present

## 2019-05-11 DIAGNOSIS — Z1231 Encounter for screening mammogram for malignant neoplasm of breast: Secondary | ICD-10-CM | POA: Diagnosis not present

## 2019-05-11 DIAGNOSIS — Z01419 Encounter for gynecological examination (general) (routine) without abnormal findings: Secondary | ICD-10-CM | POA: Diagnosis not present

## 2019-05-11 DIAGNOSIS — M8580 Other specified disorders of bone density and structure, unspecified site: Secondary | ICD-10-CM | POA: Diagnosis not present

## 2019-05-12 ENCOUNTER — Other Ambulatory Visit: Payer: Self-pay

## 2019-05-12 ENCOUNTER — Ambulatory Visit (INDEPENDENT_AMBULATORY_CARE_PROVIDER_SITE_OTHER): Payer: Medicare Other | Admitting: Podiatry

## 2019-05-12 DIAGNOSIS — M2041 Other hammer toe(s) (acquired), right foot: Secondary | ICD-10-CM

## 2019-05-12 DIAGNOSIS — M21611 Bunion of right foot: Secondary | ICD-10-CM | POA: Diagnosis not present

## 2019-05-12 DIAGNOSIS — M25571 Pain in right ankle and joints of right foot: Secondary | ICD-10-CM

## 2019-05-12 NOTE — Progress Notes (Signed)
Subjective:  Patient ID: Tonya Robinson, female    DOB: [birth year],  MRN: [id number]  Chief Complaint  Patient presents with  . tarsitis    F/U Rt tarsistis and foot pain Pt. states," the swelling in the ankle is a little less, and the pain is better at the ankle. The toe spacer b/w my toes has helped a lot, with less pain on my toes; 5/10 occasional pains." Tx: toe spacer, stretching and IBU -better with swelling and redness at bunion    78 y.o. female presents with the above complaint. Hx above confirmed with patient.   Review of Systems: Negative except as noted in the HPI. Denies N/V/F/Ch.  Past Medical History:  Diagnosis Date  . Arthritis   . Fibromyalgia   . Hypertension   . Osteoporosis   . Polymyalgia rheumatica ([place])   . Thyroid disease   . Tissue necrosis with gangrene in peripheral vascular disease ([place]) RIGHT HIP    Current Outpatient Medications:  .  Adalimumab (HUMIRA) 40 MG/0.4ML PSKT, Inject 40 mg into the skin every 14 (fourteen) days. On fridays, Disp: , Rfl:  .  ALPRAZolam (XANAX) 0.25 MG tablet, Take 1 tab tid prn anxiety and hs prn insomnia. (Patient taking differently: Take 0.25 mg by mouth daily as needed for anxiety. ), Disp: 60 tablet, Rfl: 0 .  amLODipine (NORVASC) 2.5 MG tablet, Take 2.5 mg by mouth daily., Disp: , Rfl:  .  aspirin 81 MG tablet, Take 81 mg by mouth daily., Disp: , Rfl:  .  Calcium Carbonate-Vitamin D (CALCIUM 600+D PO), Take 1 tablet by mouth 3 (three) times daily., Disp: , Rfl:  .  cholecalciferol (VITAMIN D) 1000 units tablet, Take 1,000 Units by mouth daily., Disp: , Rfl:  .  erythromycin (ERY-TAB) 500 MG EC tablet, Take 1,000 mg by mouth See admin instructions. Take 1000 mg 1 hour prior to dental procedures, Disp: , Rfl:  .  FLUoxetine (PROZAC) 10 MG capsule, TAKE ONE CAPSULE BY MOUTH ONCE DAILY, Disp: 90 capsule, Rfl: 0 .  folic acid (FOLVITE) 1 MG tablet, Take 2 mg by mouth daily., Disp: , Rfl:  .  ibuprofen (ADVIL,MOTRIN) 200 MG  tablet, Take 400 mg by mouth 3 (three) times daily., Disp: , Rfl:  .  levothyroxine (SYNTHROID, LEVOTHROID) 25 MCG tablet, Take 1 tablet (25 mcg total) by mouth daily before breakfast., Disp: 90 tablet, Rfl: 2 .  Magnesium 400 MG TABS, Take 400 mg by mouth daily., Disp: , Rfl:  .  methotrexate (RHEUMATREX) 2.5 MG tablet, Take 20 mg by mouth every Monday. Caution:Chemotherapy. Protect from light. Takes 8 tablets weekly , Disp: , Rfl:  .  metoprolol succinate (TOPROL-XL) 25 MG 24 hr tablet, Take 12.5 mg by mouth daily. , Disp: , Rfl:  .  Omega-3 Fatty Acids (FISH OIL) 1200 MG CAPS, Take 1,200 mg by mouth daily., Disp: , Rfl:  .  predniSONE (DELTASONE) 1 MG tablet, Take 3 mg by mouth daily. , Disp: , Rfl:  .  Specialty Vitamins Products (BIOTIN PLUS KERATIN) 10000-100 MCG-MG TABS, Take 1 tablet by mouth 3 (three) times a week., Disp: , Rfl:  .  vitamin B-12 (CYANOCOBALAMIN) 1000 MCG tablet, Take 1,000 mcg by mouth daily., Disp: , Rfl:  .  White Petrolatum-Mineral Oil (SYSTANE NIGHTTIME) OINT, Apply 1 application to eye at bedtime., Disp: , Rfl:  .  Zinc 50 MG TABS, Take 50 mg by mouth daily., Disp: , Rfl:   Social History   Tobacco Use  Smoking Status Former Smoker  . Packs/day: 0.25  . Types: Cigarettes  Smokeless Tobacco Never Used    Allergies  Allergen Reactions  . Valtrex [Valacyclovir]   . Penicillins Rash    Childhood allergy Has patient had a PCN reaction causing immediate rash, facial/tongue/throat swelling, SOB or lightheadedness with hypotension: Yes Has patient had a PCN reaction causing severe rash involving mucus membranes or skin necrosis: No Has patient had a PCN reaction that required hospitalization: No Has patient had a PCN reaction occurring within the last 10 years: No If all of the above answers are "NO", then may proceed with Cephalosporin use.    Objective:  There were no vitals filed for this visit. There is no height or weight on file to calculate BMI.  Constitutional Well developed. Well nourished.  Vascular Dorsalis pedis pulses palpable bilaterally. Posterior tibial pulses palpable bilaterally. Capillary refill normal to all digits.  No cyanosis or clubbing noted. Pedal hair growth normal.  Neurologic Normal speech. Oriented to person, place, and time. Epicritic sensation to light touch grossly present bilaterally.  Dermatologic Nails well groomed and normal in appearance. No open wounds. No skin lesions.  Orthopedic: Mild POP right sinus tarsi' HAV right with lesser digital contractures   Radiographs: none Assessment:   1. Sinus tarsi syndrome, right   2. Hammertoe of right foot   3. Bunion of right foot    Plan:  Patient was evaluated and treated and all questions answered.  Sinus Tarsitis Right -Improving, no repeat injection today -F/u for further injection should pain persist  Hallux Valgus -Continue toe spacer -Discusss 1st MPJ fusion and possible Hammertoe corrections should pain persist   Return if symptoms worsen or fail to improve.

## 2019-05-14 DIAGNOSIS — Z1159 Encounter for screening for other viral diseases: Secondary | ICD-10-CM | POA: Diagnosis not present

## 2019-05-17 DIAGNOSIS — Z86010 Personal history of colonic polyps: Secondary | ICD-10-CM | POA: Diagnosis not present

## 2019-05-17 DIAGNOSIS — K635 Polyp of colon: Secondary | ICD-10-CM | POA: Diagnosis not present

## 2019-05-17 DIAGNOSIS — K5730 Diverticulosis of large intestine without perforation or abscess without bleeding: Secondary | ICD-10-CM | POA: Diagnosis not present

## 2019-05-22 ENCOUNTER — Ambulatory Visit: Payer: Medicare Other | Admitting: Psychology

## 2019-05-22 DIAGNOSIS — K635 Polyp of colon: Secondary | ICD-10-CM | POA: Diagnosis not present

## 2019-05-25 DIAGNOSIS — Z79899 Other long term (current) drug therapy: Secondary | ICD-10-CM | POA: Diagnosis not present

## 2019-05-28 DIAGNOSIS — Z79899 Other long term (current) drug therapy: Secondary | ICD-10-CM | POA: Diagnosis not present

## 2019-05-28 DIAGNOSIS — M0609 Rheumatoid arthritis without rheumatoid factor, multiple sites: Secondary | ICD-10-CM | POA: Diagnosis not present

## 2019-05-30 DIAGNOSIS — M150 Primary generalized (osteo)arthritis: Secondary | ICD-10-CM | POA: Diagnosis not present

## 2019-05-30 DIAGNOSIS — M0579 Rheumatoid arthritis with rheumatoid factor of multiple sites without organ or systems involvement: Secondary | ICD-10-CM | POA: Diagnosis not present

## 2019-05-30 DIAGNOSIS — M2550 Pain in unspecified joint: Secondary | ICD-10-CM | POA: Diagnosis not present

## 2019-05-30 DIAGNOSIS — M353 Polymyalgia rheumatica: Secondary | ICD-10-CM | POA: Diagnosis not present

## 2019-05-30 DIAGNOSIS — Z79899 Other long term (current) drug therapy: Secondary | ICD-10-CM | POA: Diagnosis not present

## 2019-05-30 DIAGNOSIS — Z681 Body mass index (BMI) 19 or less, adult: Secondary | ICD-10-CM | POA: Diagnosis not present

## 2019-06-13 DIAGNOSIS — M0579 Rheumatoid arthritis with rheumatoid factor of multiple sites without organ or systems involvement: Secondary | ICD-10-CM | POA: Diagnosis not present

## 2019-06-13 DIAGNOSIS — Z79899 Other long term (current) drug therapy: Secondary | ICD-10-CM | POA: Diagnosis not present

## 2019-07-11 DIAGNOSIS — M0579 Rheumatoid arthritis with rheumatoid factor of multiple sites without organ or systems involvement: Secondary | ICD-10-CM | POA: Diagnosis not present

## 2019-07-18 ENCOUNTER — Ambulatory Visit (INDEPENDENT_AMBULATORY_CARE_PROVIDER_SITE_OTHER): Payer: Medicare Other | Admitting: Psychology

## 2019-07-18 DIAGNOSIS — F411 Generalized anxiety disorder: Secondary | ICD-10-CM | POA: Diagnosis not present

## 2019-07-19 ENCOUNTER — Ambulatory Visit: Payer: Medicare Other | Attending: Internal Medicine

## 2019-07-19 DIAGNOSIS — Z23 Encounter for immunization: Secondary | ICD-10-CM | POA: Insufficient documentation

## 2019-07-19 NOTE — Progress Notes (Signed)
   Covid-19 Vaccination Clinic  Name:  Tonya Robinson    MRN: [id number] DOB: 1941/03/31  07/19/2019  [name] was observed post Covid-19 immunization for 15 minutes without incidence. She was provided with Vaccine Information Sheet and instruction to access the V-Safe system.   [name] was instructed to call 911 with any severe reactions post vaccine: Marland Kitchen Difficulty breathing  . Swelling of your face and throat  . A fast heartbeat  . A bad rash all over your body  . Dizziness and weakness    Immunizations Administered    Name Date Dose VIS Date Route   Pfizer COVID-19 Vaccine 07/19/2019  9:42 AM 0.3 mL 06/15/2019 Intramuscular   Manufacturer: [place]   Lot: [id number]   [place]: [id number]

## 2019-07-31 DIAGNOSIS — E039 Hypothyroidism, unspecified: Secondary | ICD-10-CM | POA: Diagnosis not present

## 2019-07-31 DIAGNOSIS — M069 Rheumatoid arthritis, unspecified: Secondary | ICD-10-CM | POA: Diagnosis not present

## 2019-07-31 DIAGNOSIS — M8580 Other specified disorders of bone density and structure, unspecified site: Secondary | ICD-10-CM | POA: Diagnosis not present

## 2019-07-31 DIAGNOSIS — F419 Anxiety disorder, unspecified: Secondary | ICD-10-CM | POA: Diagnosis not present

## 2019-07-31 DIAGNOSIS — R600 Localized edema: Secondary | ICD-10-CM | POA: Diagnosis not present

## 2019-07-31 DIAGNOSIS — I10 Essential (primary) hypertension: Secondary | ICD-10-CM | POA: Diagnosis not present

## 2019-07-31 DIAGNOSIS — M21611 Bunion of right foot: Secondary | ICD-10-CM | POA: Diagnosis not present

## 2019-07-31 DIAGNOSIS — I7389 Other specified peripheral vascular diseases: Secondary | ICD-10-CM | POA: Diagnosis not present

## 2019-07-31 DIAGNOSIS — E7800 Pure hypercholesterolemia, unspecified: Secondary | ICD-10-CM | POA: Diagnosis not present

## 2019-07-31 DIAGNOSIS — M2042 Other hammer toe(s) (acquired), left foot: Secondary | ICD-10-CM | POA: Diagnosis not present

## 2019-07-31 DIAGNOSIS — M25561 Pain in right knee: Secondary | ICD-10-CM | POA: Diagnosis not present

## 2019-08-01 DIAGNOSIS — M8580 Other specified disorders of bone density and structure, unspecified site: Secondary | ICD-10-CM | POA: Diagnosis not present

## 2019-08-01 DIAGNOSIS — M069 Rheumatoid arthritis, unspecified: Secondary | ICD-10-CM | POA: Diagnosis not present

## 2019-08-01 DIAGNOSIS — M0609 Rheumatoid arthritis without rheumatoid factor, multiple sites: Secondary | ICD-10-CM | POA: Diagnosis not present

## 2019-08-01 DIAGNOSIS — I10 Essential (primary) hypertension: Secondary | ICD-10-CM | POA: Diagnosis not present

## 2019-08-01 DIAGNOSIS — E7800 Pure hypercholesterolemia, unspecified: Secondary | ICD-10-CM | POA: Diagnosis not present

## 2019-08-01 DIAGNOSIS — M1711 Unilateral primary osteoarthritis, right knee: Secondary | ICD-10-CM | POA: Diagnosis not present

## 2019-08-01 DIAGNOSIS — E039 Hypothyroidism, unspecified: Secondary | ICD-10-CM | POA: Diagnosis not present

## 2019-08-03 DIAGNOSIS — M25561 Pain in right knee: Secondary | ICD-10-CM | POA: Insufficient documentation

## 2019-08-07 DIAGNOSIS — M238X1 Other internal derangements of right knee: Secondary | ICD-10-CM | POA: Diagnosis not present

## 2019-08-07 DIAGNOSIS — M1711 Unilateral primary osteoarthritis, right knee: Secondary | ICD-10-CM | POA: Diagnosis not present

## 2019-08-07 DIAGNOSIS — M25561 Pain in right knee: Secondary | ICD-10-CM | POA: Diagnosis not present

## 2019-08-07 DIAGNOSIS — M2391 Unspecified internal derangement of right knee: Secondary | ICD-10-CM | POA: Diagnosis not present

## 2019-08-09 ENCOUNTER — Ambulatory Visit: Payer: Medicare Other | Attending: Internal Medicine

## 2019-08-09 DIAGNOSIS — Z23 Encounter for immunization: Secondary | ICD-10-CM | POA: Insufficient documentation

## 2019-08-09 NOTE — Progress Notes (Signed)
   Covid-19 Vaccination Clinic  Name:  Tonya Robinson    MRN: [id number] DOB: 11-Nov-1940  08/09/2019  Ms. Tonya Robinson was observed post Covid-19 immunization for 15 minutes without incidence. She was provided with Vaccine Information Sheet and instruction to access the V-Safe system.   Tonya Robinson was instructed to call 911 with any severe reactions post vaccine: Marland Kitchen Difficulty breathing  . Swelling of your face and throat  . A fast heartbeat  . A bad rash all over your body  . Dizziness and weakness    Immunizations Administered    Name Date Dose VIS Date Route   Pfizer COVID-19 Vaccine 08/09/2019  9:22 AM 0.3 mL 06/15/2019 Intramuscular   Manufacturer: [place]   Lot: [id number]   [place]: [id number]

## 2019-08-14 ENCOUNTER — Ambulatory Visit (INDEPENDENT_AMBULATORY_CARE_PROVIDER_SITE_OTHER): Payer: Medicare Other | Admitting: Psychology

## 2019-08-14 DIAGNOSIS — F411 Generalized anxiety disorder: Secondary | ICD-10-CM | POA: Diagnosis not present

## 2019-09-14 DIAGNOSIS — M0609 Rheumatoid arthritis without rheumatoid factor, multiple sites: Secondary | ICD-10-CM | POA: Diagnosis not present

## 2019-09-14 DIAGNOSIS — Z79899 Other long term (current) drug therapy: Secondary | ICD-10-CM | POA: Diagnosis not present

## 2019-09-19 DIAGNOSIS — M25551 Pain in right hip: Secondary | ICD-10-CM | POA: Diagnosis not present

## 2019-09-19 DIAGNOSIS — Z96641 Presence of right artificial hip joint: Secondary | ICD-10-CM | POA: Diagnosis not present

## 2019-09-20 DIAGNOSIS — M25559 Pain in unspecified hip: Secondary | ICD-10-CM | POA: Diagnosis not present

## 2019-09-20 DIAGNOSIS — S81802A Unspecified open wound, left lower leg, initial encounter: Secondary | ICD-10-CM | POA: Diagnosis not present

## 2019-09-20 DIAGNOSIS — S81812A Laceration without foreign body, left lower leg, initial encounter: Secondary | ICD-10-CM | POA: Diagnosis not present

## 2019-09-20 DIAGNOSIS — Z96649 Presence of unspecified artificial hip joint: Secondary | ICD-10-CM | POA: Diagnosis not present

## 2019-09-25 ENCOUNTER — Other Ambulatory Visit (HOSPITAL_COMMUNITY): Payer: Self-pay | Admitting: Orthopaedic Surgery

## 2019-09-25 ENCOUNTER — Other Ambulatory Visit: Payer: Self-pay | Admitting: Orthopaedic Surgery

## 2019-09-25 DIAGNOSIS — E039 Hypothyroidism, unspecified: Secondary | ICD-10-CM | POA: Diagnosis not present

## 2019-09-25 DIAGNOSIS — R232 Flushing: Secondary | ICD-10-CM | POA: Diagnosis not present

## 2019-09-25 DIAGNOSIS — M25551 Pain in right hip: Secondary | ICD-10-CM

## 2019-09-25 DIAGNOSIS — F419 Anxiety disorder, unspecified: Secondary | ICD-10-CM | POA: Diagnosis not present

## 2019-09-25 DIAGNOSIS — Z96641 Presence of right artificial hip joint: Secondary | ICD-10-CM

## 2019-09-25 DIAGNOSIS — S81812A Laceration without foreign body, left lower leg, initial encounter: Secondary | ICD-10-CM | POA: Diagnosis not present

## 2019-09-25 DIAGNOSIS — R350 Frequency of micturition: Secondary | ICD-10-CM | POA: Diagnosis not present

## 2019-09-27 ENCOUNTER — Other Ambulatory Visit: Payer: Self-pay

## 2019-09-27 ENCOUNTER — Ambulatory Visit (INDEPENDENT_AMBULATORY_CARE_PROVIDER_SITE_OTHER): Payer: Medicare Other | Admitting: Podiatry

## 2019-09-27 DIAGNOSIS — S81802A Unspecified open wound, left lower leg, initial encounter: Secondary | ICD-10-CM | POA: Diagnosis not present

## 2019-09-27 DIAGNOSIS — M25571 Pain in right ankle and joints of right foot: Secondary | ICD-10-CM | POA: Diagnosis not present

## 2019-09-27 NOTE — Progress Notes (Signed)
  Subjective:  Patient ID: Tonya Robinson, female    DOB: 02/01/1941,  MRN: [id number]  Chief Complaint  Patient presents with  . Ankle Pain    Pt states pain and swelling has returned in right ankle, experiencing a flare up. Pt states that injection was helpful at previous visits.     79 y.o. female presents with the above complaint. History confirmed with patient.   Objective:  Physical Exam: warm, good capillary refill, no trophic changes or ulcerative lesions, normal DP and PT pulses and normal sensory exam. Left Foot: left leg wound with intact dermabond and steri-strips. No warmth erythema and signs of infection Right Foot: pain at the sinus tarsi   Assessment:   1. Sinus tarsi syndrome, right   2. Wound of left lower extremity, initial encounter    Plan:  Patient was evaluated and treated and all questions answered.  Wound Left Leg -Wound appears dry with intact dermabond and steri-strips.   Sinus Tarsitis -Injection delivered to the sinus tarsi  Procedure: Injection Intermediate Joint Consent: Verbal consent obtained. Location: Right sinus tarsi. Skin Prep: alcohol. Injectate: 1 cc 0.5% marcaine plain, 1 cc celestone Disposition: Patient tolerated procedure well. Injection site dressed with a band-aid.  Return in about 2 weeks (around 10/11/2019) for Right sinus tarsitis, left leg wound f/u.

## 2019-10-01 ENCOUNTER — Encounter (HOSPITAL_COMMUNITY)
Admission: RE | Admit: 2019-10-01 | Discharge: 2019-10-01 | Disposition: A | Discharge status: Home / Self Care | Payer: Medicare Other | Source: Ambulatory Visit | Attending: Orthopaedic Surgery | Admitting: Orthopaedic Surgery

## 2019-10-01 ENCOUNTER — Other Ambulatory Visit: Payer: Self-pay

## 2019-10-01 DIAGNOSIS — M25551 Pain in right hip: Secondary | ICD-10-CM

## 2019-10-01 DIAGNOSIS — Z96641 Presence of right artificial hip joint: Secondary | ICD-10-CM | POA: Diagnosis not present

## 2019-10-01 IMAGING — NM NM BONE 3 PHASE
10 series · 20 of 20 positions shown · non-contrast
Comparison: None

Radiographic correlation: CT RIGHT hip [DATE]

CLINICAL DATA: RIGHT hip pain for months, prior RIGHT hip
replacement surgery in [S1]

EXAM:
NUCLEAR MEDICINE 3-PHASE BONE SCAN
TECHNIQUE: Radionuclide angiographic images, immediate static blood pool
images, and 3-hour delayed static images were obtained of the hips
after intravenous injection of radiopharmaceutical.
RADIOPHARMACEUTICALS:  21.1 mCi [S1] MDP IV

[Series 1: flow · 2.07mm/px · 6 of 48 frames shown (1 of 2)]
[frame 5/48]
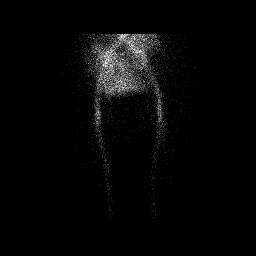
[frame 13/48]
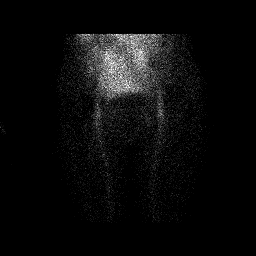
[frame 21/48]
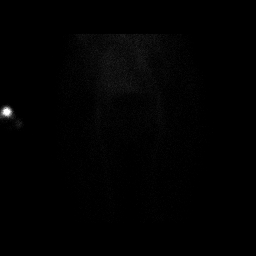
[frame 29/48]
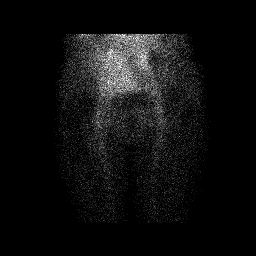
[frame 37/48]
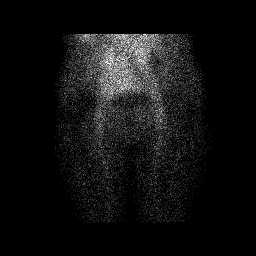
[frame 45/48]
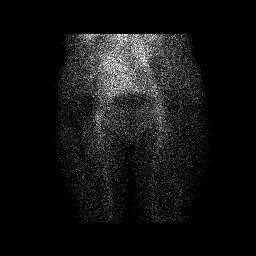

[Series 1: flow · 2.07mm/px · 6 of 48 frames shown (2 of 2)]
[frame 5/48]
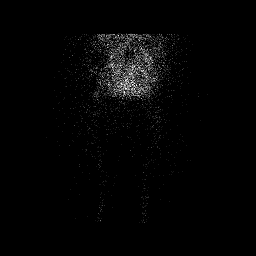
[frame 13/48]
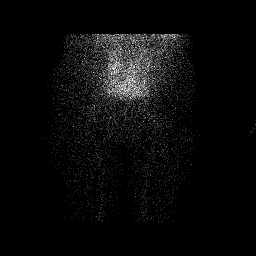
[frame 21/48]
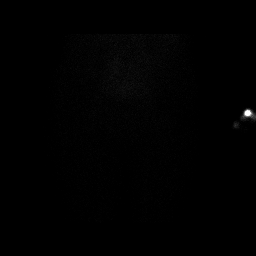
[frame 29/48]
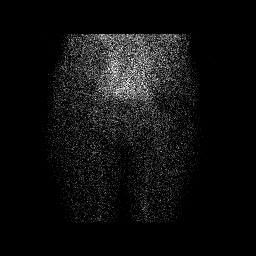
[frame 37/48]
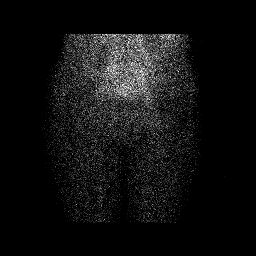
[frame 45/48]
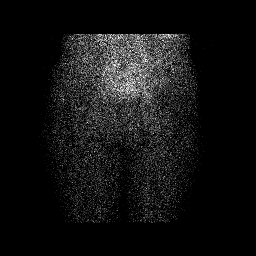

[Series 2: blood pool · 2.07mm/px · 1 of 1 slices shown (1 of 2)]
[im 1/1]
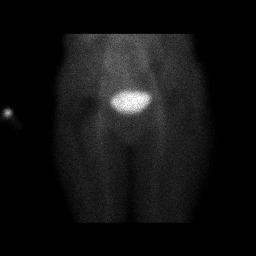

[Series 2: blood pool · 2.07mm/px · 1 of 1 slices shown (2 of 2)]
[im 1/1]
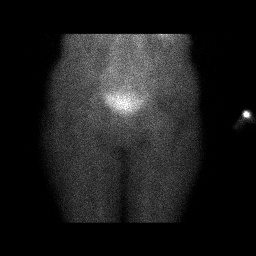

[Series 3: lat bp · 2.07mm/px · 1 of 1 slices shown (1 of 2)]
[im 1/1]
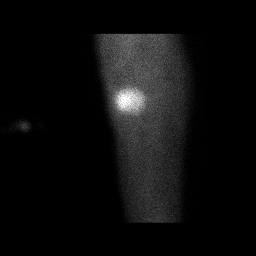

[Series 3: lat bp · 2.07mm/px · 1 of 1 slices shown (2 of 2)]
[im 1/1]
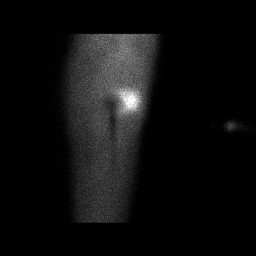

[Series 4: delay · delayed · 2.07mm/px · 1 of 1 slices shown (1 of 4)]
[im 1/1]
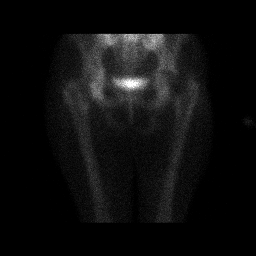

[Series 4: delay · delayed · 2.07mm/px · 1 of 1 slices shown (2 of 4)]
[im 1/1]
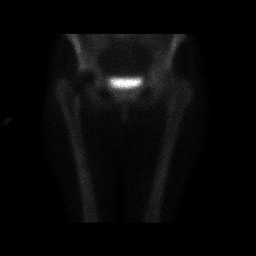

[Series 5: delay · delayed · 2.07mm/px · 1 of 1 slices shown (3 of 4)]
[im 1/1]
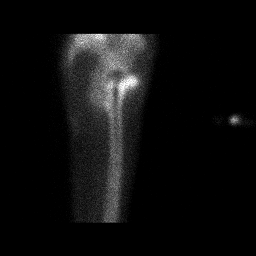

[Series 5: delay · delayed · 2.07mm/px · 1 of 1 slices shown (4 of 4)]
[im 1/1]
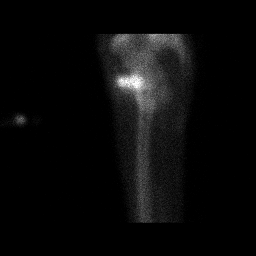

[20 of 20 positions shown; findings below may reference images not displayed]

FINDINGS: Vascular phase: Normal blood flow to the hips

Blood pool phase: Normal blood pool at the hips

Delayed phase: Photopenic defect at RIGHT hip from prosthesis. No
abnormal tracer uptake is identified adjacent to components of the
RIGHT hip prosthesis to suggest aseptic loosening loosening or
infection. Minimal degenerative type uptake at the LEFT hip. No
abnormal pelvic localization of tracer otherwise seen.
IMPRESSION: RIGHT hip prosthesis.

Minimal degenerative type uptake at LEFT hip.

No scintigraphic evidence of aseptic loosening or infection of the
RIGHT hip prosthesis.

## 2019-10-01 MED ORDER — TECHNETIUM TC 99M MEDRONATE IV KIT
20.0000 | PACK | Freq: Once | INTRAVENOUS | Status: AC | PRN
  Administered 2019-10-01: 21.1 via INTRAVENOUS

## 2019-10-02 DIAGNOSIS — S81812D Laceration without foreign body, left lower leg, subsequent encounter: Secondary | ICD-10-CM | POA: Diagnosis not present

## 2019-10-02 DIAGNOSIS — E039 Hypothyroidism, unspecified: Secondary | ICD-10-CM | POA: Diagnosis not present

## 2019-10-02 DIAGNOSIS — F419 Anxiety disorder, unspecified: Secondary | ICD-10-CM | POA: Diagnosis not present

## 2019-10-02 DIAGNOSIS — R232 Flushing: Secondary | ICD-10-CM | POA: Diagnosis not present

## 2019-10-09 DIAGNOSIS — Z681 Body mass index (BMI) 19 or less, adult: Secondary | ICD-10-CM | POA: Diagnosis not present

## 2019-10-09 DIAGNOSIS — E039 Hypothyroidism, unspecified: Secondary | ICD-10-CM | POA: Diagnosis not present

## 2019-10-09 DIAGNOSIS — M150 Primary generalized (osteo)arthritis: Secondary | ICD-10-CM | POA: Diagnosis not present

## 2019-10-09 DIAGNOSIS — M0579 Rheumatoid arthritis with rheumatoid factor of multiple sites without organ or systems involvement: Secondary | ICD-10-CM | POA: Diagnosis not present

## 2019-10-09 DIAGNOSIS — M353 Polymyalgia rheumatica: Secondary | ICD-10-CM | POA: Diagnosis not present

## 2019-10-09 DIAGNOSIS — M2550 Pain in unspecified joint: Secondary | ICD-10-CM | POA: Diagnosis not present

## 2019-10-09 DIAGNOSIS — T148XXD Other injury of unspecified body region, subsequent encounter: Secondary | ICD-10-CM | POA: Diagnosis not present

## 2019-10-10 ENCOUNTER — Ambulatory Visit: Payer: PRIVATE HEALTH INSURANCE | Admitting: Psychology

## 2019-10-11 ENCOUNTER — Other Ambulatory Visit: Payer: Self-pay

## 2019-10-11 ENCOUNTER — Ambulatory Visit (INDEPENDENT_AMBULATORY_CARE_PROVIDER_SITE_OTHER): Payer: Medicare Other | Admitting: Podiatry

## 2019-10-11 DIAGNOSIS — S81802A Unspecified open wound, left lower leg, initial encounter: Secondary | ICD-10-CM

## 2019-10-11 DIAGNOSIS — M25571 Pain in right ankle and joints of right foot: Secondary | ICD-10-CM

## 2019-10-14 DIAGNOSIS — S0181XA Laceration without foreign body of other part of head, initial encounter: Secondary | ICD-10-CM | POA: Diagnosis not present

## 2019-10-14 DIAGNOSIS — S51811A Laceration without foreign body of right forearm, initial encounter: Secondary | ICD-10-CM | POA: Diagnosis not present

## 2019-10-14 DIAGNOSIS — W5503XA Scratched by cat, initial encounter: Secondary | ICD-10-CM | POA: Diagnosis not present

## 2019-10-14 DIAGNOSIS — S71019A Laceration without foreign body, unspecified hip, initial encounter: Secondary | ICD-10-CM | POA: Diagnosis not present

## 2019-10-17 DIAGNOSIS — W5503XA Scratched by cat, initial encounter: Secondary | ICD-10-CM | POA: Diagnosis not present

## 2019-10-17 DIAGNOSIS — S71019A Laceration without foreign body, unspecified hip, initial encounter: Secondary | ICD-10-CM | POA: Diagnosis not present

## 2019-10-17 DIAGNOSIS — S0181XA Laceration without foreign body of other part of head, initial encounter: Secondary | ICD-10-CM | POA: Diagnosis not present

## 2019-10-17 DIAGNOSIS — S11019A Unspecified open wound of larynx, initial encounter: Secondary | ICD-10-CM | POA: Diagnosis not present

## 2019-10-17 DIAGNOSIS — S51811A Laceration without foreign body of right forearm, initial encounter: Secondary | ICD-10-CM | POA: Diagnosis not present

## 2019-10-25 ENCOUNTER — Ambulatory Visit (INDEPENDENT_AMBULATORY_CARE_PROVIDER_SITE_OTHER): Payer: Medicare Other | Admitting: Podiatry

## 2019-10-25 ENCOUNTER — Other Ambulatory Visit: Payer: Self-pay

## 2019-10-25 VITALS — Temp 97.2°F

## 2019-10-25 DIAGNOSIS — S81802D Unspecified open wound, left lower leg, subsequent encounter: Secondary | ICD-10-CM | POA: Diagnosis not present

## 2019-10-28 NOTE — Progress Notes (Signed)
  Subjective:  Patient ID: Tonya Robinson, female    DOB: [birth year],  MRN: [id number]  Chief Complaint  Patient presents with  . Wound Check    L leg. Pt stated, "No pain/bleeding/drainage/fever/chills/N&V/foul odor".    79 y.o. female presents with the above complaint. History confirmed with patient.   Objective:  Physical Exam: warm, good capillary refill, no trophic changes or ulcerative lesions, normal DP and PT pulses and normal sensory exam. Left Foot: left leg wound with intact dermabond and steri-strips. No warmth erythema and signs of infection Right Foot: No pain at the sinus tarsi   Assessment:   1. Wound of left lower extremity, subsequent encounter    Plan:  Patient was evaluated and treated and all questions answered.  Wound Left Leg -Wound healing well.  Encouraged normal showering to promote natural release of the Steri-Strips.  Follow-up in 3 weeks for recheck or sooner if the Steri-Strips fall off and the wound show signs of dehiscence  Sinus Tarsitis -Defer injection.  No follow-ups on file.

## 2019-10-29 DIAGNOSIS — W5503XD Scratched by cat, subsequent encounter: Secondary | ICD-10-CM | POA: Diagnosis not present

## 2019-10-29 DIAGNOSIS — E7800 Pure hypercholesterolemia, unspecified: Secondary | ICD-10-CM | POA: Diagnosis not present

## 2019-10-29 DIAGNOSIS — E039 Hypothyroidism, unspecified: Secondary | ICD-10-CM | POA: Diagnosis not present

## 2019-10-29 DIAGNOSIS — M0609 Rheumatoid arthritis without rheumatoid factor, multiple sites: Secondary | ICD-10-CM | POA: Diagnosis not present

## 2019-10-29 DIAGNOSIS — M069 Rheumatoid arthritis, unspecified: Secondary | ICD-10-CM | POA: Diagnosis not present

## 2019-10-29 DIAGNOSIS — M1711 Unilateral primary osteoarthritis, right knee: Secondary | ICD-10-CM | POA: Diagnosis not present

## 2019-10-29 DIAGNOSIS — I10 Essential (primary) hypertension: Secondary | ICD-10-CM | POA: Diagnosis not present

## 2019-10-29 DIAGNOSIS — S81812D Laceration without foreign body, left lower leg, subsequent encounter: Secondary | ICD-10-CM | POA: Diagnosis not present

## 2019-10-29 DIAGNOSIS — M8580 Other specified disorders of bone density and structure, unspecified site: Secondary | ICD-10-CM | POA: Diagnosis not present

## 2019-10-30 ENCOUNTER — Ambulatory Visit (INDEPENDENT_AMBULATORY_CARE_PROVIDER_SITE_OTHER): Payer: Medicare Other | Admitting: Psychology

## 2019-10-30 DIAGNOSIS — F411 Generalized anxiety disorder: Secondary | ICD-10-CM | POA: Diagnosis not present

## 2019-11-22 ENCOUNTER — Other Ambulatory Visit: Payer: Self-pay

## 2019-11-22 ENCOUNTER — Ambulatory Visit (INDEPENDENT_AMBULATORY_CARE_PROVIDER_SITE_OTHER): Payer: Medicare Other | Admitting: Podiatry

## 2019-11-22 DIAGNOSIS — S81802D Unspecified open wound, left lower leg, subsequent encounter: Secondary | ICD-10-CM

## 2019-11-22 DIAGNOSIS — M25571 Pain in right ankle and joints of right foot: Secondary | ICD-10-CM

## 2019-11-26 DIAGNOSIS — H903 Sensorineural hearing loss, bilateral: Secondary | ICD-10-CM | POA: Diagnosis not present

## 2019-12-11 DIAGNOSIS — R5383 Other fatigue: Secondary | ICD-10-CM | POA: Diagnosis not present

## 2019-12-11 DIAGNOSIS — M19049 Primary osteoarthritis, unspecified hand: Secondary | ICD-10-CM | POA: Diagnosis not present

## 2019-12-11 DIAGNOSIS — Z79899 Other long term (current) drug therapy: Secondary | ICD-10-CM | POA: Diagnosis not present

## 2019-12-11 DIAGNOSIS — H18459 Nodular corneal degeneration, unspecified eye: Secondary | ICD-10-CM | POA: Diagnosis not present

## 2019-12-11 DIAGNOSIS — M0609 Rheumatoid arthritis without rheumatoid factor, multiple sites: Secondary | ICD-10-CM | POA: Diagnosis not present

## 2019-12-11 DIAGNOSIS — H35363 Drusen (degenerative) of macula, bilateral: Secondary | ICD-10-CM | POA: Diagnosis not present

## 2019-12-11 DIAGNOSIS — Z961 Presence of intraocular lens: Secondary | ICD-10-CM | POA: Diagnosis not present

## 2019-12-11 DIAGNOSIS — H04123 Dry eye syndrome of bilateral lacrimal glands: Secondary | ICD-10-CM | POA: Diagnosis not present

## 2019-12-31 NOTE — Progress Notes (Signed)
  Subjective:  Patient ID: Tonya Robinson, female    DOB: [birth year],  MRN: [id number]  Chief Complaint  Patient presents with  . Ankle Pain    Right ankle pain and swelling is worse in the evening and gradually builds over the day.  . Wound Check    Left lower extremity wound check.     79 y.o. female presents with the above complaint. History confirmed with patient.   Objective:  Physical Exam: warm, good capillary refill, no trophic changes or ulcerative lesions, normal DP and PT pulses and normal sensory exam. Left Foot: left leg wound healed. Right Foot: pain at the sinus tarsi   Assessment:   1. Wound of left lower extremity, initial encounter   2. Sinus tarsi syndrome, right    Plan:  Patient was evaluated and treated and all questions answered.  Wound Left Leg -Wound well healed. Tubigrip for compression.  Sinus Tarsitis -Offered injection, deferred  F/u PRN for injection for pain.  No follow-ups on file.

## 2020-01-02 NOTE — Progress Notes (Signed)
  Subjective:  Patient ID: Tonya Robinson, female    DOB: Mar 31, 1941,  MRN: [id number]  Chief Complaint  Patient presents with  . Leg Problem    Left lower extremity wound check. Pt states healing well, steristrips still in place. Denies any concerns. Has continued wearing ace bandage to protect.  . Ankle Pain    Right lateral ankle pain still present.  . Foot Pain    Right dorsal foot pain still present.    79 y.o. female presents with the above complaint. History confirmed with patient.   Objective:  Physical Exam: warm, good capillary refill, no trophic changes or ulcerative lesions, normal DP and PT pulses and normal sensory exam. Left Foot: left leg wound with steri-strips. No warmth erythema and signs of infection  Right Foot: Mild pain at the sinus tarsi   Assessment:   1. Wound of left lower extremity, subsequent encounter   2. Sinus tarsi syndrome, right    Plan:  Patient was evaluated and treated and all questions answered.  Wound Left Leg -Wound healing well.  Steri strips removed. No open wound. F/u in 1 month for recheck.  Sinus Tarsitis -Defer injection.  No follow-ups on file.

## 2020-01-03 ENCOUNTER — Ambulatory Visit (INDEPENDENT_AMBULATORY_CARE_PROVIDER_SITE_OTHER): Payer: Medicare Other | Admitting: Podiatry

## 2020-01-03 ENCOUNTER — Other Ambulatory Visit: Payer: Self-pay

## 2020-01-03 DIAGNOSIS — M25571 Pain in right ankle and joints of right foot: Secondary | ICD-10-CM

## 2020-01-03 DIAGNOSIS — S81801A Unspecified open wound, right lower leg, initial encounter: Secondary | ICD-10-CM | POA: Diagnosis not present

## 2020-01-03 DIAGNOSIS — M21611 Bunion of right foot: Secondary | ICD-10-CM

## 2020-01-03 DIAGNOSIS — M2041 Other hammer toe(s) (acquired), right foot: Secondary | ICD-10-CM

## 2020-01-03 MED ORDER — SILVER SULFADIAZINE 1 % EX CREA: TOPICAL_CREAM | CUTANEOUS | 0 refills | Status: DC

## 2020-01-03 NOTE — Progress Notes (Signed)
  Subjective:  Patient ID: Tonya Robinson, female    DOB: Feb 09, 1941,  MRN: [id number]  Chief Complaint  Patient presents with  . Leg Problem    Left lower extremity wound resolved, pt states healed well without any concerns.  . Leg Problem    Right lower extremity wound, pt states unknown cause, "It did have some drainage", 2 week duration.  . Ankle Pain    Right sinus tarsitis follow up. Pt states it is not doing well, she has stiffnes, swelling, discomfort.     79 y.o. female presents with the above complaint. History confirmed with patient.   Objective:  Physical Exam: warm, good capillary refill, no trophic changes or ulcerative lesions, normal DP and PT pulses and normal sensory exam. Left Foot: left leg wound healed Right Foot: Mild pain at the sinus tarsi. 0.4x0.2 wound with soft eschar  Assessment:   1. Sinus tarsi syndrome, right   2. Wound of right lower extremity, initial encounter   3. Bunion of right foot   4. Hammertoe of right foot    Plan:  Patient was evaluated and treated and all questions answered.  Wound Left Leg -Wound healed  Sinus Tarsitis -Defer injection. -Discussed CMOS. Would like to think it over. F/u for appt for CMOs with orthotist if desired.  Wound Right leg -Traumatic etiology -Rx silvadene. Dress with compression dressing daily. -F/u should wound not improve in several weeks  Return if symptoms worsen or fail to improve.

## 2020-02-01 DIAGNOSIS — M85851 Other specified disorders of bone density and structure, right thigh: Secondary | ICD-10-CM | POA: Diagnosis not present

## 2020-02-01 DIAGNOSIS — M85852 Other specified disorders of bone density and structure, left thigh: Secondary | ICD-10-CM | POA: Diagnosis not present

## 2020-02-28 DIAGNOSIS — F4329 Adjustment disorder with other symptoms: Secondary | ICD-10-CM | POA: Diagnosis not present

## 2020-02-28 DIAGNOSIS — I10 Essential (primary) hypertension: Secondary | ICD-10-CM | POA: Diagnosis not present

## 2020-02-28 DIAGNOSIS — D692 Other nonthrombocytopenic purpura: Secondary | ICD-10-CM | POA: Diagnosis not present

## 2020-02-28 DIAGNOSIS — R6889 Other general symptoms and signs: Secondary | ICD-10-CM | POA: Diagnosis not present

## 2020-02-28 DIAGNOSIS — M0609 Rheumatoid arthritis without rheumatoid factor, multiple sites: Secondary | ICD-10-CM | POA: Diagnosis not present

## 2020-02-28 DIAGNOSIS — E039 Hypothyroidism, unspecified: Secondary | ICD-10-CM | POA: Diagnosis not present

## 2020-02-28 DIAGNOSIS — M8580 Other specified disorders of bone density and structure, unspecified site: Secondary | ICD-10-CM | POA: Diagnosis not present

## 2020-03-04 ENCOUNTER — Ambulatory Visit: Payer: Medicare Other | Attending: Internal Medicine

## 2020-03-04 DIAGNOSIS — Z23 Encounter for immunization: Secondary | ICD-10-CM

## 2020-03-04 NOTE — Progress Notes (Signed)
   Covid-19 Vaccination Clinic  Name:  Tonya Robinson    MRN: [id number] DOB: 01-10-1941  03/04/2020  Tonya Robinson was observed post Covid-19 immunization for 30 minutes based on pre-vaccination screening without incident. She was provided with Vaccine Information Sheet and instruction to access the V-Safe system.   Tonya Robinson was instructed to call 911 with any severe reactions post vaccine: Marland Kitchen Difficulty breathing  . Swelling of face and throat  . A fast heartbeat  . A bad rash all over body  . Dizziness and weakness

## 2020-03-16 DIAGNOSIS — Z23 Encounter for immunization: Secondary | ICD-10-CM | POA: Diagnosis not present

## 2020-04-01 DIAGNOSIS — H1013 Acute atopic conjunctivitis, bilateral: Secondary | ICD-10-CM | POA: Diagnosis not present

## 2020-04-03 ENCOUNTER — Ambulatory Visit: Payer: Medicare Other | Admitting: Cardiology

## 2020-04-03 ENCOUNTER — Encounter: Payer: Self-pay | Admitting: Cardiology

## 2020-04-03 ENCOUNTER — Other Ambulatory Visit: Payer: Self-pay

## 2020-04-03 VITALS — BP 115/54 | HR 72 | Resp 16 | Ht 61.0 in | Wt 98.0 lb

## 2020-04-03 DIAGNOSIS — R0609 Other forms of dyspnea: Secondary | ICD-10-CM | POA: Diagnosis not present

## 2020-04-03 DIAGNOSIS — R0600 Dyspnea, unspecified: Secondary | ICD-10-CM

## 2020-04-03 DIAGNOSIS — I351 Nonrheumatic aortic (valve) insufficiency: Secondary | ICD-10-CM | POA: Diagnosis not present

## 2020-04-03 DIAGNOSIS — I340 Nonrheumatic mitral (valve) insufficiency: Secondary | ICD-10-CM

## 2020-04-03 NOTE — Progress Notes (Signed)
Primary Physician/Referring:  Vernie Shanks, MD  Patient ID: Tonya Robinson, female    DOB: [birth year], 79 y.o.   MRN: [id number]  Chief Complaint  Patient presents with  . Follow-up    2 year  . Numbing in extremeties  . Shortness of Breath   HPI:    Tonya Robinson  is a 79 y.o. Caucasian female with polymyalgia rheumatica, fibromyalgia, Raynaud's disease, mild hyperlipidemia, hypertension, rheumatoid arthritis referred to me for evaluation of bilateral arm numbness and dyspnea on exdertion.  I had last seen her in January 2019, due to dyspnea on exertion I performed an echocardiogram and a stress test in which patient had ST elevation during stress testing hence underwent cardiac catheterization revealing normal coronary arteries.  Echocardiogram reviewed moderate AI and moderate MR without pulmonary hypertension in 2019.  She states that over the past few months she has noticed mild decreased exercise tolerance with dyspnea on exertion, no other associated chest pain or palpitations.  Also she has been noticing tingling and numbness in both her upper extremities and was concerned that this could be angina and hence wanted to see [place].  There is no exertional component to this upper extremity tingling numbness.  Past Medical History:  Diagnosis Date  . Arthritis   . Fibromyalgia   . Hypertension   . Osteoporosis   . Polymyalgia rheumatica ([place])   . Thyroid disease   . Tissue necrosis with gangrene in peripheral vascular disease (HCC) RIGHT HIP   Past Surgical History:  Procedure Laterality Date  . ABDOMINAL HYSTERECTOMY    . BREAST LUMPECTOMY Right   . KNEE CARTILAGE SURGERY Right   . RIGHT/LEFT HEART CATH AND CORONARY ANGIOGRAPHY N/A 07/08/2017   Procedure: RIGHT/LEFT HEART CATH AND CORONARY ANGIOGRAPHY;  Surgeon: Adrian Prows, MD;  Location: [place] [facility];  Service: Cardiovascular;  Laterality: N/A;   Family History  Problem Relation Age of Onset  . Hypertension Mother     . Arthritis Mother   . Heart disease Father   . Arthritis Father     Social History   Tobacco Use  . Smoking status: Former Smoker    Packs/day: 0.25    Types: Cigarettes  . Smokeless tobacco: Never Used  Substance Use Topics  . Alcohol use: Yes    Alcohol/week: 1.0 standard drink    Types: 1 Glasses of wine per week   Marital Status: Divorced  ROS  Review of Systems  Cardiovascular: Positive for dyspnea on exertion. Negative for chest pain and leg swelling.  Gastrointestinal: Negative for melena.  Neurological: Positive for paresthesias (upper extremity).   Objective  Blood pressure (!) 115/54, pulse 72, resp. rate 16, height '5\' 1"'  (1.549 m), weight 98 lb (44.5 kg), SpO2 100 %.  Vitals with BMI 04/03/2020 07/08/2017 07/08/2017  Height '5\' 1"'  - -  Weight 98 lbs - -  BMI 29.79 - -  Systolic 892 119 417  Diastolic 54 63 56  Pulse 72 79 79     Physical Exam Cardiovascular:     Rate and Rhythm: Normal rate and regular rhythm.     Pulses: Intact distal pulses.     Heart sounds: Murmur heard. High-pitched blowing midsystolic murmur is present at the apex. High-pitched blowing decrescendo early diastolic murmur is present with a grade of 2/4 at the upper right sternal border radiating to the apex.  No gallop.      Comments: No leg edema, no JVD. Pulmonary:     Effort:  Pulmonary effort is normal.     Breath sounds: Normal breath sounds.  Abdominal:     General: Bowel sounds are normal.     Palpations: Abdomen is soft.    Laboratory examination:   No results for input(s): NA, K, CL, CO2, GLUCOSE, BUN, CREATININE, CALCIUM, GFRNONAA, GFRAA in the last 8760 hours. CrCl cannot be calculated (Patient's most recent lab result is older than the maximum 21 days allowed.).  CMP Latest Ref Rng & Units 11/20/2013 09/06/2013 05/08/2013  Glucose 65 - 99 mg/dL 96 80 94  BUN 8 - 27 mg/dL '15 15 16  ' Creatinine 0.57 - 1.00 mg/dL 0.90 0.92 0.91  Sodium 134 - 144 mmol/L 140 142 141  Potassium  3.5 - 5.2 mmol/L 4.0 3.9 4.2  Chloride 97 - 108 mmol/L 100 99 100  CO2 18 - 29 mmol/L '26 26 26  ' Calcium 8.7 - 10.3 mg/dL 10.0 9.6 9.5  Total Protein 6.0 - 8.5 g/dL - 6.1 5.8(L)  Total Bilirubin 0.0 - 1.2 mg/dL - 0.7 0.6  Alkaline Phos 39 - 117 IU/L - 51 37(L)  AST 0 - 40 IU/L - 34 24  ALT 0 - 32 IU/L - 18 12   CBC Latest Ref Rng & Units 11/20/2013 09/06/2013 05/08/2013  WBC 4.6 - 10.2 K/uL 6.0 5.8 5.7  Hemoglobin 12.2 - 16.2 g/dL 12.9 13.5 13.0  Hematocrit 37.7 - 47.9 % 37.3(A) 42.1 39.2    Lipid Panel No results for input(s): CHOL, TRIG, LDLCALC, VLDL, HDL, CHOLHDL, LDLDIRECT in the last 8760 hours.  HEMOGLOBIN A1C No results found for: HGBA1C, MPG TSH No results for input(s): TSH in the last 8760 hours.  External labs:   Hemoglobin 11.900 G/ 01/10/2020 Platelets 253.000 X1 01/10/2020  Creatinine, Serum 0.990 MG/ 01/10/2020 Potassium 4.100 11/01/2017 Magnesium N/D ALT (SGPT) 18.000 IU/ 01/10/2020  TSH 2.920 02/28/2020  Labs 11/23/2016:   LFT normal, HB 13.0/HCT 38.2, microcytic indicis, platelets 242. CRP 0.6. Sedimentation rate 7.0. TSH minimally elevated at 5.56,, free thyroxine index, T3 uptake and T4 normal. Total cholesterol 219, triglycerides 49, HDL 106, LDL 103. Non-HDL cholesterol 113. Serum glucose 81 mg, BUN 21, creatinine 0.97, eGFR 56 mL.  Medications and allergies   Allergies  Allergen Reactions  . Valtrex [Valacyclovir]   . Penicillins Rash    Childhood allergy Has patient had a PCN reaction causing immediate rash, facial/tongue/throat swelling, SOB or lightheadedness with hypotension: Yes Has patient had a PCN reaction causing severe rash involving mucus membranes or skin necrosis: No Has patient had a PCN reaction that required hospitalization: No Has patient had a PCN reaction occurring within the last 10 years: No If all of the above answers are "NO", then may proceed with Cephalosporin use.      Outpatient Medications Prior to Visit  Medication Sig  Dispense Refill  . Adalimumab (HUMIRA) 40 MG/0.4ML PSKT Inject 40 mg into the skin every 14 (fourteen) days. On fridays    . ALPRAZolam (XANAX) 0.25 MG tablet Take 1 tab tid prn anxiety and hs prn insomnia. (Patient taking differently: Take 0.25 mg by mouth daily as needed for anxiety. ) 60 tablet 0  . amLODipine (NORVASC) 2.5 MG tablet Take 2.5 mg by mouth daily.    Marland Kitchen aspirin 81 MG tablet Take 81 mg by mouth daily.    . Calcium Carbonate-Vitamin D (CALCIUM 600+D PO) Take 1 tablet by mouth 3 (three) times daily.    Marland Kitchen erythromycin (ERY-TAB) 500 MG EC tablet Take 1,000 mg by mouth See  admin instructions. Take 1000 mg 1 hour prior to dental procedures    . FLUoxetine (PROZAC) 10 MG capsule TAKE ONE CAPSULE BY MOUTH ONCE DAILY 90 capsule 0  . folic acid (FOLVITE) 1 MG tablet Take 2 mg by mouth daily.    Marland Kitchen ibuprofen (ADVIL,MOTRIN) 200 MG tablet Take 400 mg by mouth 3 (three) times daily.    Marland Kitchen levothyroxine (SYNTHROID, LEVOTHROID) 25 MCG tablet Take 1 tablet (25 mcg total) by mouth daily before breakfast. 90 tablet 2  . Magnesium 400 MG TABS Take 500 mg by mouth daily.     . methotrexate (RHEUMATREX) 2.5 MG tablet Take 20 mg by mouth every Monday. Caution:Chemotherapy. Protect from light. Takes 8 tablets weekly     . metoprolol succinate (TOPROL-XL) 25 MG 24 hr tablet Take 12.5 mg by mouth daily.     . Omega-3 Fatty Acids (FISH OIL) 1200 MG CAPS Take 1,200 mg by mouth daily.    . predniSONE (DELTASONE) 1 MG tablet Take 3 mg by mouth daily.     Marland Kitchen ascorbic acid (VITAMIN C) 1000 MG tablet Take by mouth. (Patient not taking: Reported on 04/03/2020)    . azithromycin (ZITHROMAX) 250 MG tablet TAKE 2 TABLETS BY MOUTH TODAY, THEN TAKE 1 TABLET DAILY FOR 4 DAYS (Patient not taking: Reported on 04/03/2020)    . cholecalciferol (VITAMIN D) 1000 units tablet Take 1,000 Units by mouth daily. (Patient not taking: Reported on 04/03/2020)    . furosemide (LASIX) 20 MG tablet Take 20 mg by mouth as needed.    .  neomycin-polymyxin b-dexamethasone (MAXITROL) 3.5-10000-0.1 SUSP     . silver sulfADIAZINE (SILVADENE) 1 % cream Apply pea-sized amount to wound daily. (Patient not taking: Reported on 04/03/2020) 50 g 0  . Specialty Vitamins Products (BIOTIN PLUS KERATIN) 10000-100 MCG-MG TABS Take 1 tablet by mouth 3 (three) times a week. (Patient not taking: Reported on 04/03/2020)    . tobramycin-dexamethasone (TOBRADEX) ophthalmic solution  (Patient not taking: Reported on 04/03/2020)    . vitamin B-12 (CYANOCOBALAMIN) 1000 MCG tablet Take 1,000 mcg by mouth daily. (Patient not taking: Reported on 04/03/2020)    . White Petrolatum-Mineral Oil (SYSTANE NIGHTTIME) OINT Apply 1 application to eye at bedtime. (Patient not taking: Reported on 04/03/2020)    . Zinc 50 MG TABS Take 50 mg by mouth daily. (Patient not taking: Reported on 04/03/2020)     No facility-administered medications prior to visit.    Radiology:   No results found.  Cardiac Studies:   Echocardiogram 01/18/2017 Left ventricle cavity is normal in size. Normal global wall motion. Doppler evidence of grade I (impaired) diastolic dysfunction, normal LAP. Calculated EF 56%. Trileaflet aortic valve with mild to moderate regurgitation. Mild to moderate posteriorly directed mitral regurgitation. Mild tricuspid regurgitation. No evidence of pulmonary hypertension. Compared to the study done on 10/29/2015, mitral regurgitation is slightly worse. Otherwise no significant change.   Nuclear stress test  [01/07/2017]: 01/07/2017 1. The resting electrocardiogram demonstrated normal sinus rhythm, normal resting conduction, no resting arrhythmias and normal rest repolarization. The stress electrocardiogram was positive for ischemia with 2 mm horizontal ST depression noed at peak exercise persisting for > 2 minutes into recovery. Stress symptoms included dyspnea and chest pressure. Patient exercised on Bruce protocol for 7:00 minutes and achieved 8.55 METS. Stress  test terminated due to chest pressure, dyspnea and 88% MPHR achieved (Target HR >85%). 2. Myocardial perfusion imaging is normal. Overall left ventricular systolic function was normal without regional wall motion abnormalities. The  left ventricular ejection fraction was 80%. This is an intermediate risk study, clinical correlation recommended in view of symptoms and abnormal EKG.  Left and right heart catheterization 07/08/2017:  Normal coronary arteries and right heart pressure.  EKG:   EKG 04/03/2020: Normal sinus rhythm with rate of 71 bpm, normal axis.  No evidence of ischemia.  Single PAC.  Normal EKG.      Assessment     ICD-10-CM   1. Moderate mitral regurgitation  I34.0 PCV ECHOCARDIOGRAM COMPLETE  2. Dyspnea on exertion  R06.00 EKG 12-Lead    PCV ECHOCARDIOGRAM COMPLETE  3. Moderate aortic regurgitation  I35.1 PCV ECHOCARDIOGRAM COMPLETE    There are no discontinued medications.  No orders of the defined types were placed in this encounter.  Recommendations:   Tonya Robinson is a 79 y.o. Caucasian female with polymyalgia rheumatica, fibromyalgia, Raynaud's disease, mild hyperlipidemia, hypertension, rheumatoid arthritis referred to me for evaluation of bilateral arm numbness and dyspnea on exdertion.  I had last seen her in January 2019, due to dyspnea on exertion I performed an echocardiogram and a stress test in which patient had ST elevation during stress testing hence underwent cardiac catheterization revealing normal coronary arteries.  Echocardiogram reviewed moderate AI and moderate MR without pulmonary hypertension in 2019.  Her symptoms of tingling and numbness in her arms is more consistent with probable radicular pain from cervical spine disease.  Do not suspect angina pectoris or Prinzmetal's angina.  No significant change in physical exam with regard to aortic regurgitation or mitral regurgitation however in view of new dyspnea, I have recommended an echocardiogram.   I would like to see her back in 4 weeks for follow-up.  I simply reassured her.    Adrian Prows, MD, [facility] 04/03/2020, 11:30 AM Office: [phone]

## 2020-04-08 ENCOUNTER — Other Ambulatory Visit: Payer: Self-pay

## 2020-04-08 ENCOUNTER — Ambulatory Visit: Payer: Medicare Other

## 2020-04-08 DIAGNOSIS — I351 Nonrheumatic aortic (valve) insufficiency: Secondary | ICD-10-CM | POA: Diagnosis not present

## 2020-04-08 DIAGNOSIS — R0600 Dyspnea, unspecified: Secondary | ICD-10-CM

## 2020-04-08 DIAGNOSIS — R0609 Other forms of dyspnea: Secondary | ICD-10-CM

## 2020-04-08 DIAGNOSIS — I340 Nonrheumatic mitral (valve) insufficiency: Secondary | ICD-10-CM

## 2020-04-14 DIAGNOSIS — M150 Primary generalized (osteo)arthritis: Secondary | ICD-10-CM | POA: Diagnosis not present

## 2020-04-14 DIAGNOSIS — M0579 Rheumatoid arthritis with rheumatoid factor of multiple sites without organ or systems involvement: Secondary | ICD-10-CM | POA: Diagnosis not present

## 2020-04-14 DIAGNOSIS — M353 Polymyalgia rheumatica: Secondary | ICD-10-CM | POA: Diagnosis not present

## 2020-04-14 DIAGNOSIS — Z681 Body mass index (BMI) 19 or less, adult: Secondary | ICD-10-CM | POA: Diagnosis not present

## 2020-04-14 DIAGNOSIS — M2550 Pain in unspecified joint: Secondary | ICD-10-CM | POA: Diagnosis not present

## 2020-04-14 DIAGNOSIS — Z79899 Other long term (current) drug therapy: Secondary | ICD-10-CM | POA: Diagnosis not present

## 2020-04-18 ENCOUNTER — Ambulatory Visit (INDEPENDENT_AMBULATORY_CARE_PROVIDER_SITE_OTHER): Payer: Medicare Other | Admitting: Podiatry

## 2020-04-18 ENCOUNTER — Other Ambulatory Visit: Payer: Self-pay

## 2020-04-18 DIAGNOSIS — M25571 Pain in right ankle and joints of right foot: Secondary | ICD-10-CM | POA: Diagnosis not present

## 2020-04-18 NOTE — Progress Notes (Signed)
Normal LV systolic function.  Aortic regurgitation has slightly progressed.  Images reviewed personally, reverberation artifact, otherwise no significant abnormality.

## 2020-05-05 NOTE — Progress Notes (Addendum)
  Subjective:  Patient ID: Tonya Robinson, female    DOB: Nov 25, 1940,  MRN: [id number]  Chief Complaint  Patient presents with  . Ankle Pain    requested steriod injection     79 y.o. female presents with the above complaint. History confirmed with patient.   Objective:  Physical Exam: warm, good capillary refill, no trophic changes or ulcerative lesions, normal DP and PT pulses and normal sensory exam. Left Foot: left leg wound healed Right Foot: Mild pain at the sinus tarsi.  Assessment:   1. Sinus tarsi syndrome, right    Plan:  Patient was evaluated and treated and all questions answered.  Wound Left Leg -Wound healed  Sinus Tarsitis -Repeat injection today -Dispense trilock ASO  Procedure: Injection Intermediate Joint Consent: Verbal consent obtained. Location: Right sinus tarsi. Skin Prep: alcohol. Injectate: 1 cc 0.5% marcaine plain, 1 cc dexamethasone phosphate, 0.5 cc kenalog 10. Disposition: Patient tolerated procedure well. Injection site dressed with a band-aid.    No follow-ups on file.

## 2020-05-09 ENCOUNTER — Other Ambulatory Visit: Payer: Self-pay

## 2020-05-09 ENCOUNTER — Ambulatory Visit: Payer: Medicare Other | Admitting: Cardiology

## 2020-05-09 ENCOUNTER — Encounter: Payer: Self-pay | Admitting: Cardiology

## 2020-05-09 VITALS — BP 110/61 | HR 87 | Resp 16 | Ht 61.0 in | Wt 94.0 lb

## 2020-05-09 DIAGNOSIS — I351 Nonrheumatic aortic (valve) insufficiency: Secondary | ICD-10-CM | POA: Diagnosis not present

## 2020-05-09 DIAGNOSIS — R0609 Other forms of dyspnea: Secondary | ICD-10-CM | POA: Diagnosis not present

## 2020-05-09 DIAGNOSIS — R0600 Dyspnea, unspecified: Secondary | ICD-10-CM

## 2020-05-09 NOTE — Progress Notes (Signed)
Primary Physician/Referring:  Vernie Shanks, MD  Patient ID: Tonya Robinson, female    DOB: January 15, 1941, 79 y.o.   MRN: [id number]  Chief Complaint  Patient presents with  . Moderate mitral regurgitation  . Shortness of Breath  . Follow-up    4 week   HPI:    Tonya Robinson  is a 79 y.o. Caucasian female with polymyalgia rheumatica, fibromyalgia, Raynaud's disease, mild hyperlipidemia, hypertension, rheumatoid arthritis referred to me for evaluation of bilateral arm numbness and dyspnea on exdertion.  I had last seen her in January 2019, due to dyspnea on exertion I performed an echocardiogram and a stress test in which patient had ST elevation during stress testing hence underwent cardiac catheterization revealing normal coronary arteries.  Echocardiogram reviewed moderate AI and moderate MR without pulmonary hypertension in 2019.  I have seen her a month ago when she presented with mild gradual worsening dyspnea and also bilateral arm numbness.  I had reassured her and repeated echocardiogram and she now presents for follow-up.  She is doing well and no significant change in her symptoms.  No PND or orthopnea, no leg edema.  Past Medical History:  Diagnosis Date  . Arthritis   . Fibromyalgia   . Hypertension   . Osteoporosis   . Polymyalgia rheumatica ([place])   . Thyroid disease   . Tissue necrosis with gangrene in peripheral vascular disease (HCC) RIGHT HIP   Past Surgical History:  Procedure Laterality Date  . ABDOMINAL HYSTERECTOMY    . BREAST LUMPECTOMY Right   . KNEE CARTILAGE SURGERY Right   . RIGHT/LEFT HEART CATH AND CORONARY ANGIOGRAPHY N/A 07/08/2017   Procedure: RIGHT/LEFT HEART CATH AND CORONARY ANGIOGRAPHY;  Surgeon: Adrian Prows, MD;  Location: [place] [facility];  Service: Cardiovascular;  Laterality: N/A;   Family History  Problem Relation Age of Onset  . Hypertension Mother   . Arthritis Mother   . Heart disease Father   . Arthritis Father     Social History     Tobacco Use  . Smoking status: Former Smoker    Packs/day: 0.25    Types: Cigarettes  . Smokeless tobacco: Never Used  Substance Use Topics  . Alcohol use: Yes    Alcohol/week: 1.0 standard drink    Types: 1 Glasses of wine per week   Marital Status: Divorced  ROS  Review of Systems  Cardiovascular: Positive for dyspnea on exertion. Negative for chest pain and leg swelling.  Gastrointestinal: Negative for melena.  Neurological: Positive for paresthesias (upper extremity).   Objective  Blood pressure 110/61, pulse 87, resp. rate 16, height _0  (1.549 m), weight 94 lb (42.6 kg), SpO2 100 %.  Vitals with BMI 05/09/2020 04/03/2020 07/08/2017  Height _1  _2  -  Weight 94 lbs 98 lbs -  BMI 26.20 35.59 -  Systolic 741 638 453  Diastolic 61 54 63  Pulse 87 72 79     Physical Exam Cardiovascular:     Rate and Rhythm: Normal rate and regular rhythm.     Pulses: Intact distal pulses.     Heart sounds: Murmur heard. High-pitched blowing midsystolic murmur is present at the apex. High-pitched blowing decrescendo early diastolic murmur is present with a grade of 2/4 at the upper right sternal border radiating to the apex.  No gallop.      Comments: No leg edema, no JVD. Pulmonary:     Effort: Pulmonary effort is normal.     Breath sounds: Normal  breath sounds.  Abdominal:     General: Bowel sounds are normal.     Palpations: Abdomen is soft.    Laboratory examination:   External labs:   Hemoglobin 11.900 G/ 01/10/2020 Platelets 253.000 X1 01/10/2020  Creatinine, Serum 0.990 MG/ 01/10/2020 Potassium 4.100 11/01/2017 Magnesium N/D ALT (SGPT) 18.000 IU/ 01/10/2020  TSH 2.920 02/28/2020  Labs 11/23/2016:   LFT normal, HB 13.0/HCT 38.2, microcytic indicis, platelets 242. CRP 0.6. Sedimentation rate 7.0. TSH minimally elevated at 5.56,, free thyroxine index, T3 uptake and T4 normal. Total cholesterol 219, triglycerides 49, HDL 106, LDL 103. Non-HDL cholesterol 113. Serum glucose 81  mg, BUN 21, creatinine 0.97, eGFR 56 mL.  Medications and allergies   Allergies  Allergen Reactions  . Valtrex [Valacyclovir]   . Penicillins Rash    Childhood allergy Has patient had a PCN reaction causing immediate rash, facial/tongue/throat swelling, SOB or lightheadedness with hypotension: Yes Has patient had a PCN reaction causing severe rash involving mucus membranes or skin necrosis: No Has patient had a PCN reaction that required hospitalization: No Has patient had a PCN reaction occurring within the last 10 years: No If all of the above answers are "NO", then may proceed with Cephalosporin use.      Outpatient Medications Prior to Visit  Medication Sig Dispense Refill  . Adalimumab (HUMIRA) 40 MG/0.4ML PSKT Inject 40 mg into the skin every 14 (fourteen) days. On fridays    . ALPRAZolam (XANAX) 0.25 MG tablet Take 1 tab tid prn anxiety and hs prn insomnia. (Patient taking differently: Take 0.25 mg by mouth daily as needed for anxiety. ) 60 tablet 0  . amLODipine (NORVASC) 2.5 MG tablet Take 2.5 mg by mouth daily.    . Calcium Carbonate-Vitamin D (CALCIUM 600+D PO) Take 1 tablet by mouth 3 (three) times daily.    Marland Kitchen erythromycin (ERY-TAB) 500 MG EC tablet Take 1,000 mg by mouth See admin instructions. Take 1000 mg 1 hour prior to dental procedures    . FLUoxetine (PROZAC) 10 MG capsule TAKE ONE CAPSULE BY MOUTH ONCE DAILY 90 capsule 0  . folic acid (FOLVITE) 1 MG tablet Take 2 mg by mouth daily.    . furosemide (LASIX) 20 MG tablet Take 20 mg by mouth as needed.    Marland Kitchen ibuprofen (ADVIL,MOTRIN) 200 MG tablet Take 400 mg by mouth 3 (three) times daily.    Marland Kitchen levothyroxine (SYNTHROID, LEVOTHROID) 25 MCG tablet Take 1 tablet (25 mcg total) by mouth daily before breakfast. 90 tablet 2  . methotrexate (RHEUMATREX) 2.5 MG tablet Take 20 mg by mouth every Monday. Caution:Chemotherapy. Protect from light. Takes 8 tablets weekly     . metoprolol succinate (TOPROL-XL) 25 MG 24 hr tablet Take  12.5 mg by mouth daily.     . Omega-3 Fatty Acids (FISH OIL) 1200 MG CAPS Take 1,200 mg by mouth daily.    . predniSONE (DELTASONE) 1 MG tablet Take 3 mg by mouth daily.     Marland Kitchen aspirin 81 MG tablet Take 81 mg by mouth daily.    . Magnesium 400 MG TABS Take 500 mg by mouth daily.     Marland Kitchen neomycin-polymyxin b-dexamethasone (MAXITROL) 3.5-10000-0.1 SUSP      No facility-administered medications prior to visit.    Radiology:   No results found.  Cardiac Studies:   Nuclear stress test  [01/17/17]: Jan 17, 2017 1. The resting electrocardiogram demonstrated normal sinus rhythm, normal resting conduction, no resting arrhythmias and normal rest repolarization. The stress electrocardiogram was positive for  ischemia with 2 mm horizontal ST depression noed at peak exercise persisting for > 2 minutes into recovery. Stress symptoms included dyspnea and chest pressure. Patient exercised on Bruce protocol for 7:00 minutes and achieved 8.55 METS. Stress test terminated due to chest pressure, dyspnea and 88% MPHR achieved (Target HR >85%). 2. Myocardial perfusion imaging is normal. Overall left ventricular systolic function was normal without regional wall motion abnormalities. The left ventricular ejection fraction was 80%. This is an intermediate risk study, clinical correlation recommended in view of symptoms and abnormal EKG.  Left and right heart catheterization 07/08/2017:  Normal coronary arteries and right heart pressure.  Echocardiogram 04/08/2020: Normal LV systolic function with visual EF 60-65%. Left ventricle cavity is normal in size. Normal global wall motion. Normal diastolic filling pattern, normal LAP.  Right atrial cavity is normal in size. An echo density noted in the right atrium at the level of the tricuspid best seen in RV inflow and subcostal view (differential includes: reverberation artifact, vegetation).  Moderate (Grade III) aortic regurgitation. Mild (Grade I) mitral  regurgitation. Mild tricuspid regurgitation. Insignificant pericardial effusion. Recommendation: Consider transesophageal echocardiogram to re-evaluate the RA and RV.  Compared to prior echo 01/18/2017: AR is now moderate no other significant change.  Personally reviewed the echocardiogram, the density seen in the right atrium is just mitral and calcification and mild tricuspid annular calcification reverberation artifact.  No indication for TEE.  EKG:   EKG 04/03/2020: Normal sinus rhythm with rate of 71 bpm, normal axis.  No evidence of ischemia.  Single PAC.  Normal EKG.      Assessment   No diagnosis found.  Medications Discontinued During This Encounter  Medication Reason  . aspirin 81 MG tablet Patient Preference  . Magnesium 400 MG TABS Patient Preference  . neomycin-polymyxin b-dexamethasone (MAXITROL) 3.5-10000-0.1 SUSP Completed Course    No orders of the defined types were placed in this encounter.  Recommendations:   Tonya Robinson is a 79 y.o. Caucasian female with polymyalgia rheumatica, fibromyalgia, Raynaud's disease, mild hyperlipidemia, hypertension, rheumatoid arthritis referred to me for evaluation of bilateral arm numbness and dyspnea on exdertion.  I repeated an echocardiogram, she now presents for follow-up, had expected that she will have moderate aortic regurgitation by physical exam.  I reviewed the results of the echocardiogram, there is no endocarditis, it is just reverberation artifact, she does have indeed moderate aortic regurgitation.  There is no clinical evidence of heart failure.  We will continue to do annual follow-up clinically and if necessary I will repeat an echocardiogram next year.  With regard to her arm discomfort, it is probably related to cervical spine osteoarthritis.  Advised her to continue to push herself and remain active, dyspnea probably age-related and deconditioning.  No clinical evidence heart failure.  I did not make any changes to  her medication.  Blood pressures well controlled.  I will see her back in 1 year or sooner if she has a problem.   Adrian Prows, MD, [facility] 05/09/2020, 10:47 AM Office: [phone]

## 2020-05-22 DIAGNOSIS — F4329 Adjustment disorder with other symptoms: Secondary | ICD-10-CM | POA: Diagnosis not present

## 2020-05-22 DIAGNOSIS — E039 Hypothyroidism, unspecified: Secondary | ICD-10-CM | POA: Diagnosis not present

## 2020-05-22 DIAGNOSIS — M21611 Bunion of right foot: Secondary | ICD-10-CM | POA: Diagnosis not present

## 2020-05-22 DIAGNOSIS — L989 Disorder of the skin and subcutaneous tissue, unspecified: Secondary | ICD-10-CM | POA: Diagnosis not present

## 2020-05-22 DIAGNOSIS — Z0000 Encounter for general adult medical examination without abnormal findings: Secondary | ICD-10-CM | POA: Diagnosis not present

## 2020-05-22 DIAGNOSIS — M8580 Other specified disorders of bone density and structure, unspecified site: Secondary | ICD-10-CM | POA: Diagnosis not present

## 2020-05-22 DIAGNOSIS — I10 Essential (primary) hypertension: Secondary | ICD-10-CM | POA: Diagnosis not present

## 2020-05-22 DIAGNOSIS — D692 Other nonthrombocytopenic purpura: Secondary | ICD-10-CM | POA: Diagnosis not present

## 2020-05-22 DIAGNOSIS — M0609 Rheumatoid arthritis without rheumatoid factor, multiple sites: Secondary | ICD-10-CM | POA: Diagnosis not present

## 2020-05-22 DIAGNOSIS — Z23 Encounter for immunization: Secondary | ICD-10-CM | POA: Diagnosis not present

## 2020-05-22 DIAGNOSIS — Z1389 Encounter for screening for other disorder: Secondary | ICD-10-CM | POA: Diagnosis not present

## 2020-06-03 ENCOUNTER — Ambulatory Visit (INDEPENDENT_AMBULATORY_CARE_PROVIDER_SITE_OTHER): Payer: Medicare Other | Admitting: Podiatry

## 2020-06-03 ENCOUNTER — Encounter: Payer: Self-pay | Admitting: Podiatry

## 2020-06-03 ENCOUNTER — Other Ambulatory Visit: Payer: Self-pay

## 2020-06-03 DIAGNOSIS — M25571 Pain in right ankle and joints of right foot: Secondary | ICD-10-CM

## 2020-06-03 DIAGNOSIS — M21611 Bunion of right foot: Secondary | ICD-10-CM | POA: Diagnosis not present

## 2020-06-03 DIAGNOSIS — M2041 Other hammer toe(s) (acquired), right foot: Secondary | ICD-10-CM

## 2020-06-03 DIAGNOSIS — M069 Rheumatoid arthritis, unspecified: Secondary | ICD-10-CM

## 2020-06-03 NOTE — Progress Notes (Signed)
  Subjective:  Patient ID: Tonya Robinson, female    DOB: 19-Sep-1940,  MRN: [id number]  Chief Complaint  Patient presents with  . Ankle Pain    Discuss surgery. Pt states last injection provided relief for 4-5 days. Pt states it causes her hip to hurt to try and wear the boot so she has not been doing this. Pt states her pain is throbbing.  Tonya Robinson Toe    Discusss surgery  . Bunions    Discuss surgery    79 y.o. female presents with the above complaint. History confirmed with patient.   Objective:  Physical Exam: warm, good capillary refill, no trophic changes or ulcerative lesions, normal DP and PT pulses and normal sensory exam. Left Foot: left leg wound healed Right Foot: Mild pain at the sinus tarsi. HAV with POP. Hammertoes present  Assessment:   1. Sinus tarsi syndrome, right   2. Bunion of right foot   3. Hammertoe of right foot   4. Rheumatoid arthritis of right foot, unspecified whether rheumatoid factor present [facility])    Plan:  Patient was evaluated and treated and all questions answered.  Wound Left Leg -Wound healed  Sinus Tarsitis -Order MRI for further eval -Will benefit from surgical intervention as she has failed conservative therapy for over a year.  HAV, Hammertoes -Would benefit from surgical discussion for these issues. Hold off until spring per patient request. Likely would benefit from left 4th/5th correction.  Xerosis -Recommend AmLactin lotion.  No follow-ups on file.

## 2020-06-04 DIAGNOSIS — H04123 Dry eye syndrome of bilateral lacrimal glands: Secondary | ICD-10-CM | POA: Diagnosis not present

## 2020-06-20 ENCOUNTER — Ambulatory Visit
Admission: RE | Admit: 2020-06-20 | Discharge: 2020-06-20 | Disposition: A | Discharge status: Home / Self Care | Payer: Medicare Other | Source: Ambulatory Visit | Attending: Podiatry | Admitting: Podiatry

## 2020-06-20 ENCOUNTER — Other Ambulatory Visit: Payer: Self-pay

## 2020-06-20 DIAGNOSIS — M19071 Primary osteoarthritis, right ankle and foot: Secondary | ICD-10-CM | POA: Diagnosis not present

## 2020-06-20 DIAGNOSIS — M25571 Pain in right ankle and joints of right foot: Secondary | ICD-10-CM

## 2020-06-20 DIAGNOSIS — R600 Localized edema: Secondary | ICD-10-CM | POA: Diagnosis not present

## 2020-06-20 DIAGNOSIS — M069 Rheumatoid arthritis, unspecified: Secondary | ICD-10-CM | POA: Diagnosis not present

## 2020-06-20 DIAGNOSIS — M7989 Other specified soft tissue disorders: Secondary | ICD-10-CM | POA: Diagnosis not present

## 2020-06-20 IMAGING — MR MR ANKLE*R* W/O CM
6 series · 40 of 40 positions shown · non-contrast
Comparison: Right foot x-rays dated [DATE].

CLINICAL DATA: Chronic right ankle pain and swelling.

EXAM:
MRI OF THE RIGHT ANKLE WITHOUT CONTRAST
TECHNIQUE: Multiplanar, multisequence MR imaging of the ankle was performed. No
intravenous contrast was administered.

[Series 3: T2 fat-sat · axial · 3.0mm · 0.50mm/px · z∈[-48,+72]mm · 7 of 32 slices shown (1 of 2)]
[im 1/32]
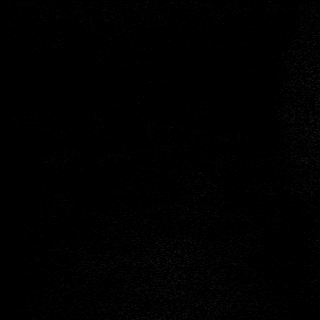
[im 6/32]
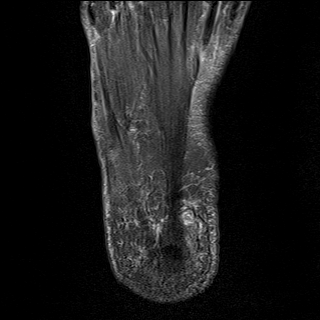
[im 11/32]
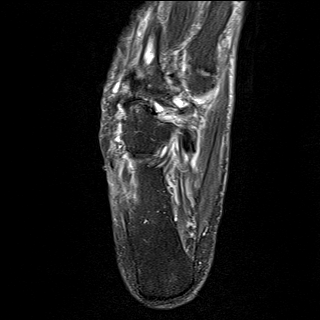
[im 16/32]
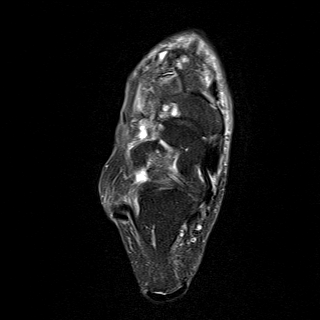
[im 21/32]
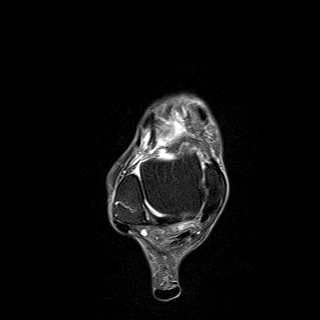
[im 26/32]
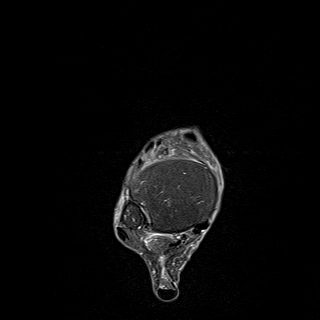
[im 32/32]
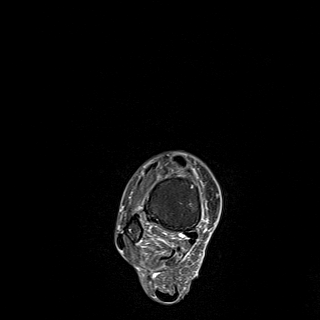

[Series 4: PD fat-sat · axial · 3.0mm · 0.50mm/px · z∈[-48,+72]mm · 8 of 32 slices shown]
[im 1/32]
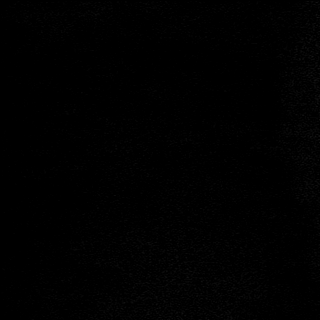
[im 5/32]
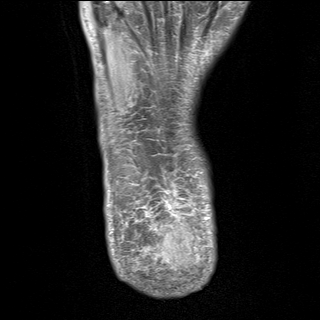
[im 9/32]
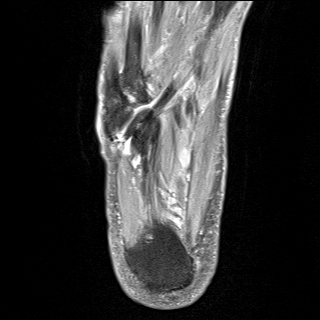
[im 14/32]
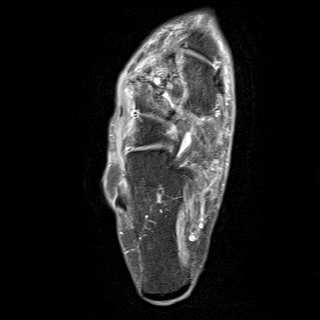
[im 18/32]
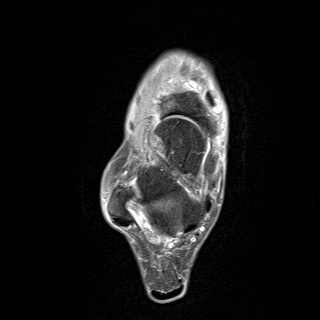
[im 23/32]
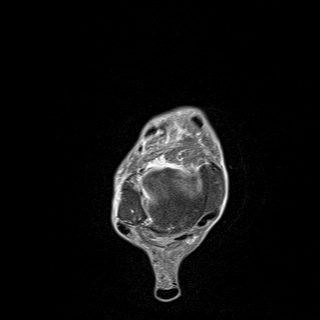
[im 27/32]
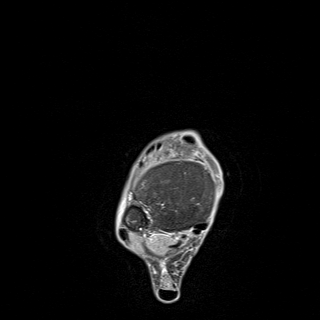
[im 32/32]
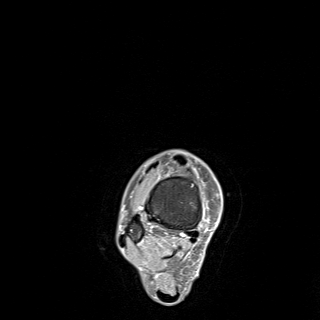

[Series 5: T1 · axial · 3.0mm · 0.50mm/px · z∈[-47,+74]mm · 8 of 32 slices shown (1 of 2)]
[im 1/32]
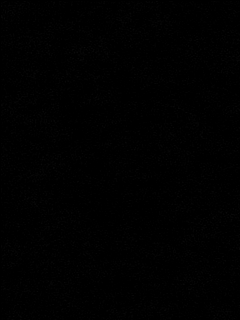
[im 5/32]
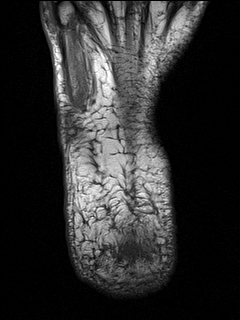
[im 9/32]
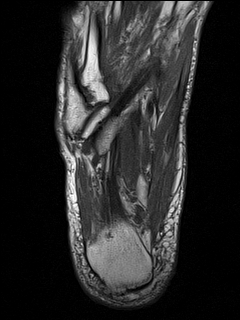
[im 14/32]
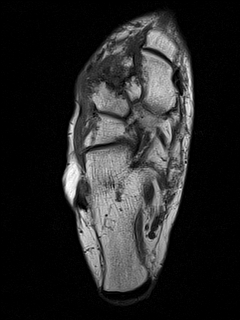
[im 18/32]
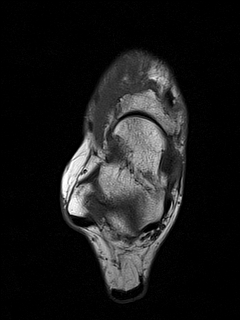
[im 23/32]
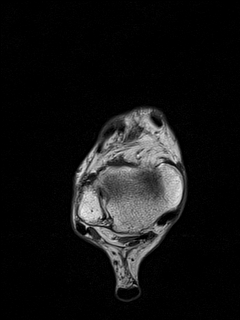
[im 27/32]
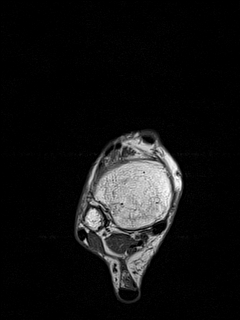
[im 32/32]
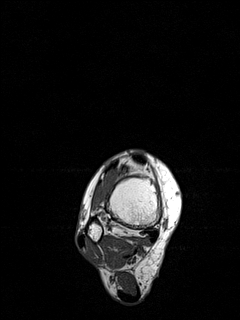

[Series 6: T1 · sagittal · 4.0mm · 0.56mm/px · 4 of 18 slices shown (2 of 2)]
[im 1/18]
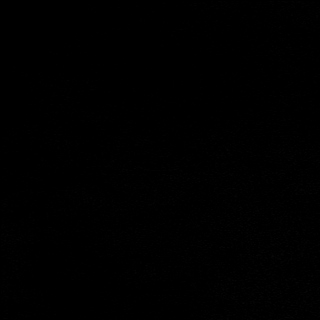
[im 6/18]
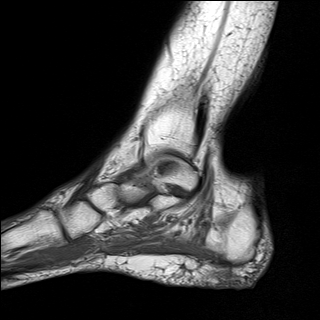
[im 12/18]
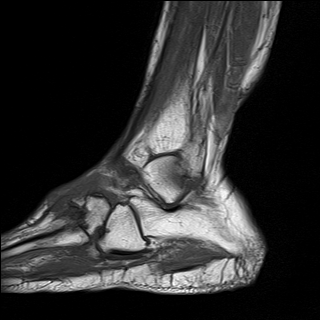
[im 18/18]
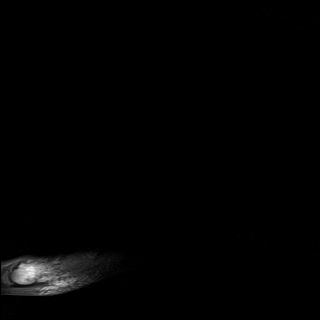

[Series 7: STIR · sagittal · 4.0mm · 0.35mm/px · 4 of 18 slices shown]
[im 1/18]
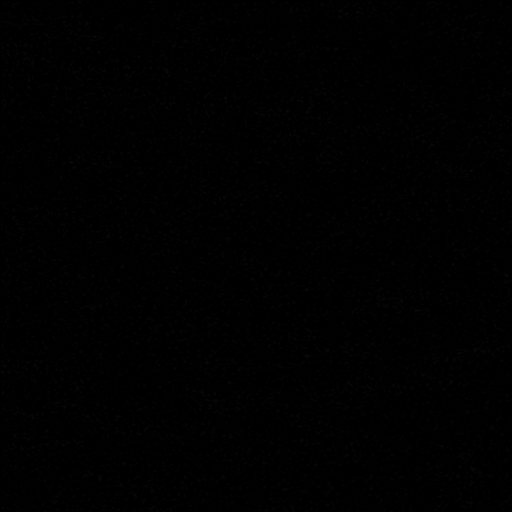
[im 6/18]
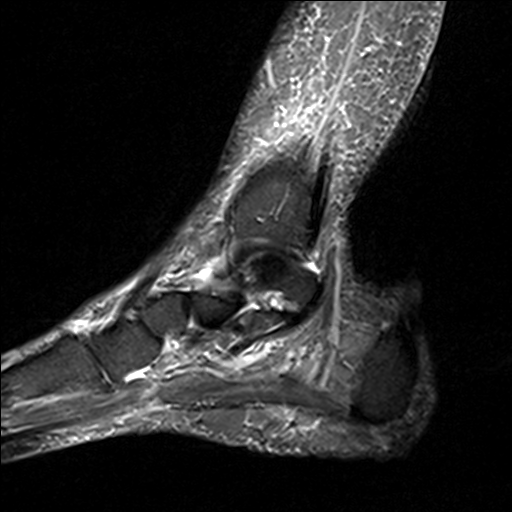
[im 12/18]
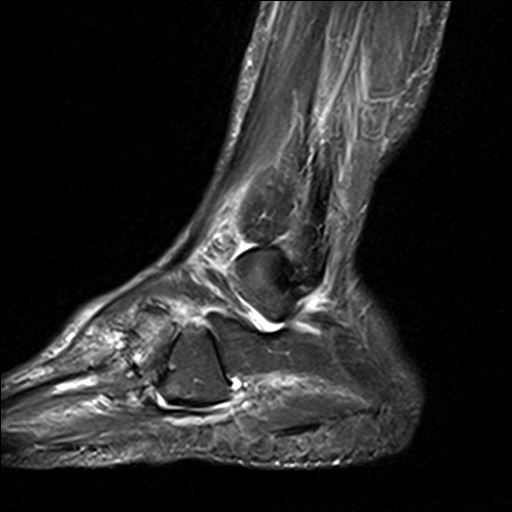
[im 18/18]
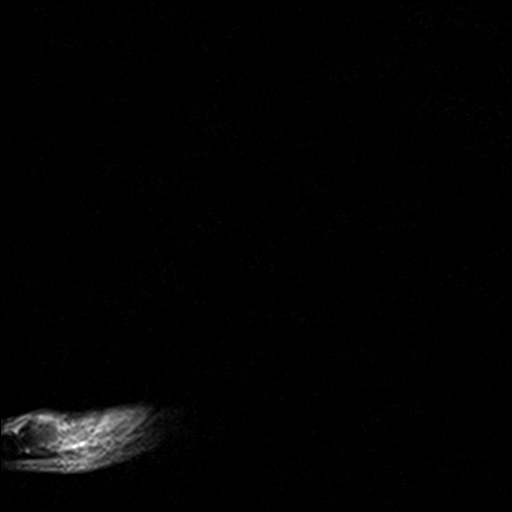

[Series 8: T2 fat-sat · coronal · 3.0mm · 0.50mm/px · 9 of 36 slices shown (2 of 2)]
[im 1/36]
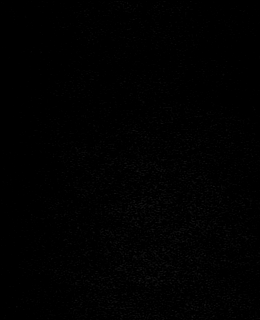
[im 5/36]
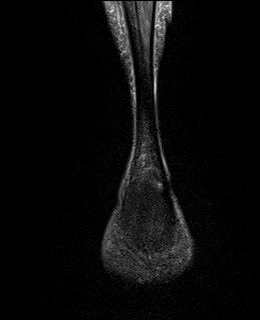
[im 9/36]
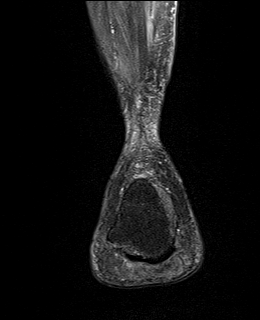
[im 14/36]
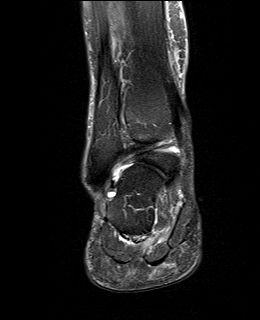
[im 18/36]
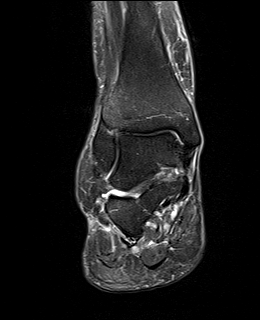
[im 22/36]
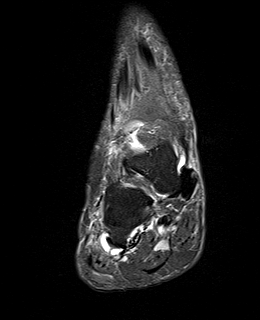
[im 27/36]
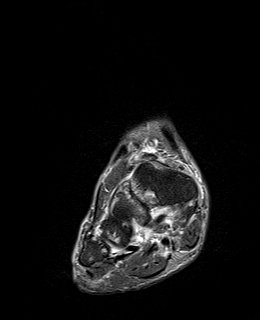
[im 31/36]
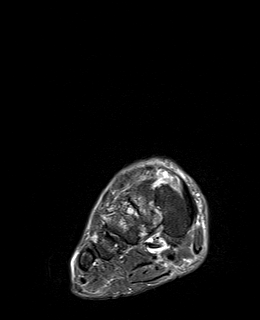
[im 36/36]
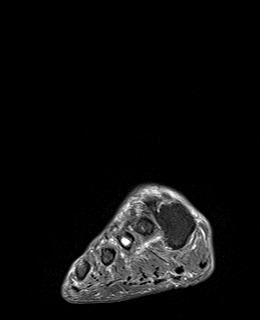

[40 of 40 positions shown; findings below may reference images not displayed]

FINDINGS: TENDONS

Peroneal: Peroneal longus tendon intact. Peroneal brevis intact.

Posteromedial: Posterior tibial tendon intact. Flexor digitorum
longus tendon intact. Flexor hallucis longus tendon intact.

Anterior: Tibialis anterior tendon intact. Extensor hallucis longus
tendon intact Extensor digitorum longus tendon intact.

Achilles:  Intact.

Plantar Fascia: Intact.

LIGAMENTS

Lateral: Anterior talofibular ligament intact. Calcaneofibular
ligament intact. Posterior talofibular ligament intact. Anterior and
posterior tibiofibular ligaments intact.

Medial: Deltoid ligament intact. Spring ligament intact.

CARTILAGE

Ankle Joint: No significant joint effusion. Normal ankle mortise. No
chondral defect.

Subtalar Joints/Sinus Tarsi: Normal subtalar joints. No subtalar
joint effusion. Normal sinus tarsi.

Bones: Small erosions of the cuneiforms, cuboid, and second through
fourth metatarsal bases. Severe second and third TMT joint space
narrowing. Mild fourth TMT joint space narrowing. Mild talonavicular
joint space narrowing with subchondral marrow edema and cystic
change in the lateral aspect of the navicular. No fracture or
dislocation.

Soft Tissue: Mild dorsal midfoot soft tissue swelling. No soft
tissue mass or fluid collection.
IMPRESSION: 1. Sequelae of midfoot rheumatoid arthritis with secondary
osteoarthritis, severe at the second and third TMT joints.

## 2020-07-02 DIAGNOSIS — M0609 Rheumatoid arthritis without rheumatoid factor, multiple sites: Secondary | ICD-10-CM | POA: Diagnosis not present

## 2020-07-02 DIAGNOSIS — L659 Nonscarring hair loss, unspecified: Secondary | ICD-10-CM | POA: Diagnosis not present

## 2020-07-02 DIAGNOSIS — Z79899 Other long term (current) drug therapy: Secondary | ICD-10-CM | POA: Diagnosis not present

## 2020-07-07 DIAGNOSIS — N390 Urinary tract infection, site not specified: Secondary | ICD-10-CM | POA: Diagnosis not present

## 2020-07-07 DIAGNOSIS — R350 Frequency of micturition: Secondary | ICD-10-CM | POA: Diagnosis not present

## 2020-07-07 DIAGNOSIS — R319 Hematuria, unspecified: Secondary | ICD-10-CM | POA: Diagnosis not present

## 2020-07-08 ENCOUNTER — Ambulatory Visit: Payer: Medicare Other | Admitting: Podiatry

## 2020-07-11 DIAGNOSIS — Z1231 Encounter for screening mammogram for malignant neoplasm of breast: Secondary | ICD-10-CM | POA: Diagnosis not present

## 2020-07-14 DIAGNOSIS — Z01419 Encounter for gynecological examination (general) (routine) without abnormal findings: Secondary | ICD-10-CM | POA: Diagnosis not present

## 2020-07-14 DIAGNOSIS — Z90710 Acquired absence of both cervix and uterus: Secondary | ICD-10-CM | POA: Diagnosis not present

## 2020-07-14 DIAGNOSIS — L989 Disorder of the skin and subcutaneous tissue, unspecified: Secondary | ICD-10-CM | POA: Diagnosis not present

## 2020-07-14 DIAGNOSIS — Z124 Encounter for screening for malignant neoplasm of cervix: Secondary | ICD-10-CM | POA: Diagnosis not present

## 2020-07-24 DIAGNOSIS — N390 Urinary tract infection, site not specified: Secondary | ICD-10-CM | POA: Diagnosis not present

## 2020-07-25 ENCOUNTER — Ambulatory Visit (INDEPENDENT_AMBULATORY_CARE_PROVIDER_SITE_OTHER): Payer: Medicare Other | Admitting: Podiatry

## 2020-07-25 ENCOUNTER — Other Ambulatory Visit: Payer: Self-pay

## 2020-07-25 DIAGNOSIS — M25571 Pain in right ankle and joints of right foot: Secondary | ICD-10-CM | POA: Diagnosis not present

## 2020-07-25 MED ORDER — BETAMETHASONE SOD PHOS & ACET 6 (3-3) MG/ML IJ SUSP: 6.0000 mg | Freq: Once | INTRAMUSCULAR | Status: DC

## 2020-07-25 MED ORDER — TRAMADOL HCL 50 MG PO TABS: 50.0000 mg | ORAL_TABLET | Freq: Three times a day (TID) | ORAL | 0 refills | Status: AC | PRN

## 2020-07-25 MED ORDER — BETAMETHASONE SOD PHOS & ACET 6 (3-3) MG/ML IJ SUSP
6.0000 mg | Freq: Once | INTRAMUSCULAR | Status: AC
  Administered 2020-07-25: 6 mg

## 2020-07-25 NOTE — Progress Notes (Signed)
  Subjective:  Patient ID: Tonya Robinson, female    DOB: 1940/09/24,  MRN: [id number]  Chief Complaint  Patient presents with  . tarsitis    F/U Rt tarsitis -pt states,' pain depend on the weather. The whole foot hurts a lot; 8/10 shapr pains Tx: none -w/. Swelling     80 y.o. female presents with the above complaint. History confirmed with patient.   Objective:  Physical Exam: warm, good capillary refill, no trophic changes or ulcerative lesions, normal DP and PT pulses and normal sensory exam. Left Foot: left leg wound healed Right Foot: Mod pain at the sinus tarsi. HAV with POP. Hammertoes present  Assessment:   1. Sinus tarsi syndrome, right    Plan:  Patient was evaluated and treated and all questions answered.  Sinus Tarsitis -Reviewed MR. No osseous abnormality at area of pain. Will repeat injection today. Consider possible STJ arthroscopy should pain persist.  Procedure: Joint Injection Location: Right STJ joint Skin Prep: alcohol. Injectate: 1 cc marcaine 0.5% plain, 1 cc celestone Disposition: Patient tolerated procedure well. Injection site dressed with a band-aid.  HAV, Hammertoes -Would benefit from surgical discussion for these issues. Hold off until spring per patient request. Likely would benefit from left 4th/5th correction.  No follow-ups on file.

## 2020-07-30 ENCOUNTER — Telehealth: Payer: Self-pay | Admitting: *Deleted

## 2020-07-30 DIAGNOSIS — M0579 Rheumatoid arthritis with rheumatoid factor of multiple sites without organ or systems involvement: Secondary | ICD-10-CM | POA: Diagnosis not present

## 2020-07-30 DIAGNOSIS — R5383 Other fatigue: Secondary | ICD-10-CM | POA: Diagnosis not present

## 2020-07-30 DIAGNOSIS — Z79899 Other long term (current) drug therapy: Secondary | ICD-10-CM | POA: Diagnosis not present

## 2020-07-30 DIAGNOSIS — Z111 Encounter for screening for respiratory tuberculosis: Secondary | ICD-10-CM | POA: Diagnosis not present

## 2020-07-30 NOTE — Telephone Encounter (Signed)
Called and informed patient per Dr Eleanora Neighbor note , verbalized understanding and said that she would f/u with appointment for her ankle.

## 2020-07-30 NOTE — Telephone Encounter (Signed)
Patient is calling and the medication given for her shoulder and neck has been causing terrific headaches, has only taken 2 tablets,pain still persists, also injection for the ankle did not last long.Please advise.

## 2020-07-30 NOTE — Telephone Encounter (Signed)
She can stop taking the pain medication - the steroids will continue to work and hopefully in a few days the pain will get better. She will need to see someone for her shoulder for the pain due to that and see if they want to adjust her medication - the pain medication was for her foot.

## 2020-08-22 DIAGNOSIS — F4329 Adjustment disorder with other symptoms: Secondary | ICD-10-CM | POA: Diagnosis not present

## 2020-08-22 DIAGNOSIS — M0609 Rheumatoid arthritis without rheumatoid factor, multiple sites: Secondary | ICD-10-CM | POA: Diagnosis not present

## 2020-08-22 DIAGNOSIS — D692 Other nonthrombocytopenic purpura: Secondary | ICD-10-CM | POA: Diagnosis not present

## 2020-08-22 DIAGNOSIS — M8580 Other specified disorders of bone density and structure, unspecified site: Secondary | ICD-10-CM | POA: Diagnosis not present

## 2020-08-22 DIAGNOSIS — I10 Essential (primary) hypertension: Secondary | ICD-10-CM | POA: Diagnosis not present

## 2020-08-22 DIAGNOSIS — Z23 Encounter for immunization: Secondary | ICD-10-CM | POA: Diagnosis not present

## 2020-08-22 DIAGNOSIS — E039 Hypothyroidism, unspecified: Secondary | ICD-10-CM | POA: Diagnosis not present

## 2020-08-22 DIAGNOSIS — M21611 Bunion of right foot: Secondary | ICD-10-CM | POA: Diagnosis not present

## 2020-09-09 ENCOUNTER — Ambulatory Visit (INDEPENDENT_AMBULATORY_CARE_PROVIDER_SITE_OTHER): Payer: Medicare Other | Admitting: Podiatry

## 2020-09-09 ENCOUNTER — Other Ambulatory Visit: Payer: Self-pay

## 2020-09-09 DIAGNOSIS — M2041 Other hammer toe(s) (acquired), right foot: Secondary | ICD-10-CM

## 2020-09-09 DIAGNOSIS — M069 Rheumatoid arthritis, unspecified: Secondary | ICD-10-CM

## 2020-09-09 DIAGNOSIS — M21611 Bunion of right foot: Secondary | ICD-10-CM | POA: Diagnosis not present

## 2020-09-09 DIAGNOSIS — M25571 Pain in right ankle and joints of right foot: Secondary | ICD-10-CM

## 2020-09-17 DIAGNOSIS — M25511 Pain in right shoulder: Secondary | ICD-10-CM | POA: Diagnosis not present

## 2020-09-17 DIAGNOSIS — M0609 Rheumatoid arthritis without rheumatoid factor, multiple sites: Secondary | ICD-10-CM | POA: Diagnosis not present

## 2020-09-17 DIAGNOSIS — Z79899 Other long term (current) drug therapy: Secondary | ICD-10-CM | POA: Diagnosis not present

## 2020-10-02 DIAGNOSIS — Z23 Encounter for immunization: Secondary | ICD-10-CM | POA: Diagnosis not present

## 2020-10-02 NOTE — Progress Notes (Signed)
  Subjective:  Patient ID: Tonya Robinson, female    DOB: [birth year],  MRN: [id number]  Chief Complaint  Patient presents with  . sinus torsi    Sinus torsi syndrome- pt pain is a 7-8/10. Sweeling. Pt has been using a topical cream.     80 y.o. female presents with the above complaint. History confirmed with patient.   Objective:  Physical Exam: warm, good capillary refill, no trophic changes or ulcerative lesions, normal DP and PT pulses and normal sensory exam. Left Foot: left leg wound healed Right Foot: Mod pain at the sinus tarsi. HAV with POP. Hammertoes present  Assessment:   1. Sinus tarsi syndrome, right   2. Bunion of right foot   3. Hammertoe of right foot   4. Rheumatoid arthritis of right foot, unspecified whether rheumatoid factor present [facility])    Plan:  Patient was evaluated and treated and all questions answered.  Sinus Tarsitis -Avoid repeat injection today.  Patient in agreement.  Would consider injection in a few weeks.  She continues to have pain and I think surgical invention may be likely for her however her MRI was not a benign-appearing.  She is getting adjustments from her rheumatologist hopefully these will help and see if this can help with her pain.  We will follow-up in several weeks to recheck  HAV, Hammertoes -Would benefit from surgical discussion for these issues. Hold off until spring per patient request. Likely would benefit from left 4th/5th correction.  No follow-ups on file.

## 2020-10-03 DIAGNOSIS — L570 Actinic keratosis: Secondary | ICD-10-CM | POA: Diagnosis not present

## 2020-10-03 DIAGNOSIS — L853 Xerosis cutis: Secondary | ICD-10-CM | POA: Diagnosis not present

## 2020-10-03 DIAGNOSIS — L300 Nummular dermatitis: Secondary | ICD-10-CM | POA: Diagnosis not present

## 2020-10-21 DIAGNOSIS — M7541 Impingement syndrome of right shoulder: Secondary | ICD-10-CM | POA: Diagnosis not present

## 2020-10-21 DIAGNOSIS — M25611 Stiffness of right shoulder, not elsewhere classified: Secondary | ICD-10-CM | POA: Diagnosis not present

## 2020-10-28 DIAGNOSIS — Z681 Body mass index (BMI) 19 or less, adult: Secondary | ICD-10-CM | POA: Diagnosis not present

## 2020-10-28 DIAGNOSIS — Z79899 Other long term (current) drug therapy: Secondary | ICD-10-CM | POA: Diagnosis not present

## 2020-10-28 DIAGNOSIS — M2550 Pain in unspecified joint: Secondary | ICD-10-CM | POA: Diagnosis not present

## 2020-10-28 DIAGNOSIS — M150 Primary generalized (osteo)arthritis: Secondary | ICD-10-CM | POA: Diagnosis not present

## 2020-10-28 DIAGNOSIS — L989 Disorder of the skin and subcutaneous tissue, unspecified: Secondary | ICD-10-CM | POA: Diagnosis not present

## 2020-10-28 DIAGNOSIS — M0579 Rheumatoid arthritis with rheumatoid factor of multiple sites without organ or systems involvement: Secondary | ICD-10-CM | POA: Diagnosis not present

## 2020-10-28 DIAGNOSIS — M353 Polymyalgia rheumatica: Secondary | ICD-10-CM | POA: Diagnosis not present

## 2020-11-10 DIAGNOSIS — M25611 Stiffness of right shoulder, not elsewhere classified: Secondary | ICD-10-CM | POA: Diagnosis not present

## 2020-11-10 DIAGNOSIS — M7541 Impingement syndrome of right shoulder: Secondary | ICD-10-CM | POA: Diagnosis not present

## 2020-11-12 DIAGNOSIS — C44612 Basal cell carcinoma of skin of right upper limb, including shoulder: Secondary | ICD-10-CM | POA: Diagnosis not present

## 2020-11-12 DIAGNOSIS — D485 Neoplasm of uncertain behavior of skin: Secondary | ICD-10-CM | POA: Diagnosis not present

## 2020-11-12 DIAGNOSIS — I788 Other diseases of capillaries: Secondary | ICD-10-CM | POA: Diagnosis not present

## 2020-11-12 DIAGNOSIS — L853 Xerosis cutis: Secondary | ICD-10-CM | POA: Diagnosis not present

## 2020-11-17 DIAGNOSIS — M7541 Impingement syndrome of right shoulder: Secondary | ICD-10-CM | POA: Diagnosis not present

## 2020-11-17 DIAGNOSIS — M25611 Stiffness of right shoulder, not elsewhere classified: Secondary | ICD-10-CM | POA: Diagnosis not present

## 2020-11-24 DIAGNOSIS — M25611 Stiffness of right shoulder, not elsewhere classified: Secondary | ICD-10-CM | POA: Diagnosis not present

## 2020-11-24 DIAGNOSIS — M7541 Impingement syndrome of right shoulder: Secondary | ICD-10-CM | POA: Diagnosis not present

## 2020-11-25 DIAGNOSIS — C44612 Basal cell carcinoma of skin of right upper limb, including shoulder: Secondary | ICD-10-CM | POA: Diagnosis not present

## 2020-11-25 DIAGNOSIS — D485 Neoplasm of uncertain behavior of skin: Secondary | ICD-10-CM | POA: Diagnosis not present

## 2020-11-28 ENCOUNTER — Ambulatory Visit (INDEPENDENT_AMBULATORY_CARE_PROVIDER_SITE_OTHER): Payer: Medicare Other | Admitting: Podiatry

## 2020-11-28 ENCOUNTER — Other Ambulatory Visit: Payer: Self-pay

## 2020-11-28 ENCOUNTER — Ambulatory Visit (INDEPENDENT_AMBULATORY_CARE_PROVIDER_SITE_OTHER): Payer: Medicare Other

## 2020-11-28 ENCOUNTER — Other Ambulatory Visit: Payer: Self-pay | Admitting: Podiatry

## 2020-11-28 DIAGNOSIS — M25571 Pain in right ankle and joints of right foot: Secondary | ICD-10-CM

## 2020-11-28 DIAGNOSIS — Z79899 Other long term (current) drug therapy: Secondary | ICD-10-CM | POA: Insufficient documentation

## 2020-11-28 DIAGNOSIS — M1991 Primary osteoarthritis, unspecified site: Secondary | ICD-10-CM | POA: Insufficient documentation

## 2020-11-28 DIAGNOSIS — M2550 Pain in unspecified joint: Secondary | ICD-10-CM | POA: Insufficient documentation

## 2020-11-28 MED ORDER — BETAMETHASONE SOD PHOS & ACET 6 (3-3) MG/ML IJ SUSP
6.0000 mg | Freq: Once | INTRAMUSCULAR | Status: AC
  Administered 2020-11-28: 6 mg

## 2020-11-28 NOTE — Progress Notes (Signed)
  Subjective:  Patient ID: JAHLIA OMURA, female    DOB: Feb 12, 1941,  MRN: [id number]  Chief Complaint  Patient presents with  . Ankle Pain    Right ankle pain and swelling going on for years now feels as if getting progressively worse due to knee and hip issues. Icing and elevating when can but rarely at home.     80 y.o. female presents with the above complaint. History confirmed with patient.   Objective:  Physical Exam: warm, good capillary refill, no trophic changes or ulcerative lesions, normal DP and PT pulses and normal sensory exam. Right Foot: Mod pain at the sinus tarsi. HAV with POP. Hammertoes present  Assessment:   1. Sinus tarsitis of right foot    Plan:  Patient was evaluated and treated and all questions answered.  Sinus Tarsitis -Repeat injection as below  Procedure: Injection Intermediate Joint Consent: Verbal consent obtained. Location: Right sinus tarsi. Skin Prep: alcohol. Injectate: 1 cc 0.5% marcaine plain, 1 cc betamethasone acetate-betamethasone sodium phosphate Disposition: Patient tolerated procedure well. Injection site dressed with a band-aid.   HAV, Hammertoes -Would benefit from surgical discussion for these issues. Hold off until RA under control. Likely would benefit from left 4th/5th correction at same time.  Return in about 5 weeks (around 01/02/2021).

## 2020-12-02 DIAGNOSIS — C44612 Basal cell carcinoma of skin of right upper limb, including shoulder: Secondary | ICD-10-CM | POA: Diagnosis not present

## 2020-12-13 DIAGNOSIS — W548XXA Other contact with dog, initial encounter: Secondary | ICD-10-CM | POA: Diagnosis not present

## 2020-12-13 DIAGNOSIS — S41111A Laceration without foreign body of right upper arm, initial encounter: Secondary | ICD-10-CM | POA: Diagnosis not present

## 2020-12-17 DIAGNOSIS — Z85828 Personal history of other malignant neoplasm of skin: Secondary | ICD-10-CM | POA: Diagnosis not present

## 2020-12-17 DIAGNOSIS — H9201 Otalgia, right ear: Secondary | ICD-10-CM | POA: Diagnosis not present

## 2020-12-17 DIAGNOSIS — H6121 Impacted cerumen, right ear: Secondary | ICD-10-CM | POA: Diagnosis not present

## 2020-12-17 DIAGNOSIS — L905 Scar conditions and fibrosis of skin: Secondary | ICD-10-CM | POA: Diagnosis not present

## 2020-12-17 DIAGNOSIS — H66001 Acute suppurative otitis media without spontaneous rupture of ear drum, right ear: Secondary | ICD-10-CM | POA: Diagnosis not present

## 2020-12-19 DIAGNOSIS — I889 Nonspecific lymphadenitis, unspecified: Secondary | ICD-10-CM | POA: Diagnosis not present

## 2020-12-23 ENCOUNTER — Other Ambulatory Visit: Payer: Self-pay | Admitting: Family Medicine

## 2020-12-23 ENCOUNTER — Inpatient Hospital Stay: Admission: RE | Admit: 2020-12-23 | Payer: Medicare Other | Source: Ambulatory Visit

## 2020-12-23 DIAGNOSIS — G44009 Cluster headache syndrome, unspecified, not intractable: Secondary | ICD-10-CM

## 2020-12-24 ENCOUNTER — Ambulatory Visit
Admission: RE | Admit: 2020-12-24 | Discharge: 2020-12-24 | Disposition: A | Discharge status: Home / Self Care | Payer: Medicare Other | Source: Ambulatory Visit | Attending: Family Medicine | Admitting: Family Medicine

## 2020-12-24 ENCOUNTER — Other Ambulatory Visit: Payer: Self-pay

## 2020-12-24 DIAGNOSIS — R519 Headache, unspecified: Secondary | ICD-10-CM | POA: Diagnosis not present

## 2020-12-24 DIAGNOSIS — G44009 Cluster headache syndrome, unspecified, not intractable: Secondary | ICD-10-CM

## 2020-12-24 IMAGING — CT CT HEAD W/O CM
1 series · 16 of 30 positions shown, 20 images · non-contrast
Comparison: None.

CLINICAL DATA: Cluster headaches, not intractable

EXAM:
CT HEAD WITHOUT CONTRAST
TECHNIQUE: Contiguous axial images were obtained from the base of the skull
through the vertex without intravenous contrast.

[Series 2: head w/(date) · axial · 0.41mm/px · z∈[-167,-22]mm · 16 of 33 slices shown, 20 images]
[im 2/33  brain]
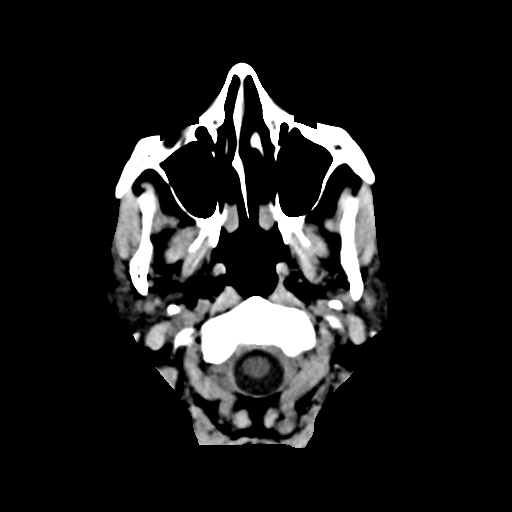
[im 2/33  bone]
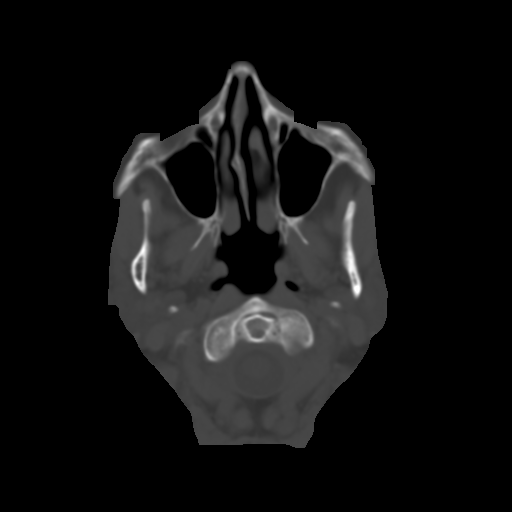
[im 4/33  brain]
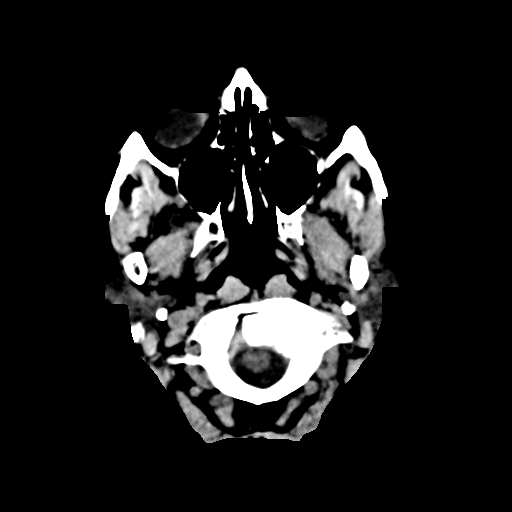
[im 6/33  brain]
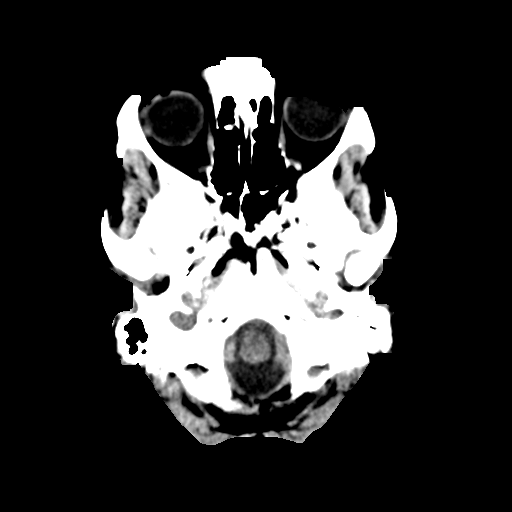
[im 8/33  brain]
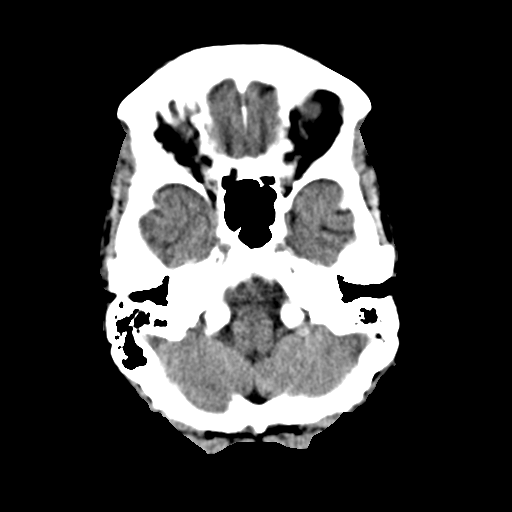
[im 9/33  brain]
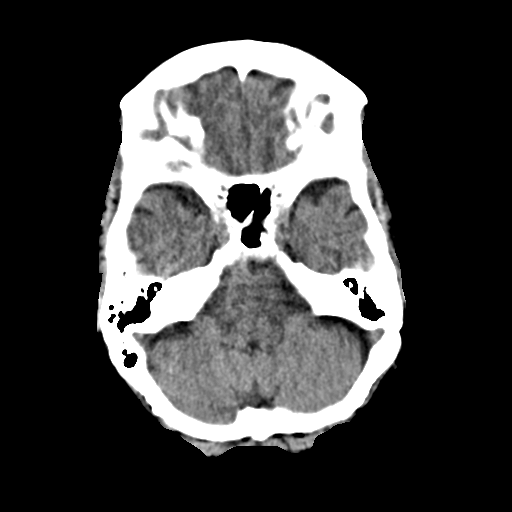
[im 9/33  bone]
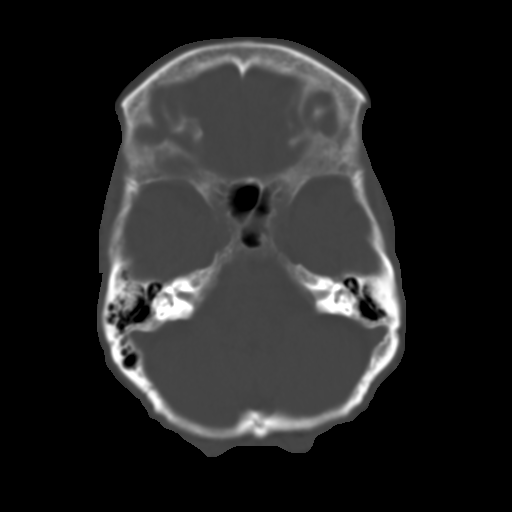
[im 12/33  brain]
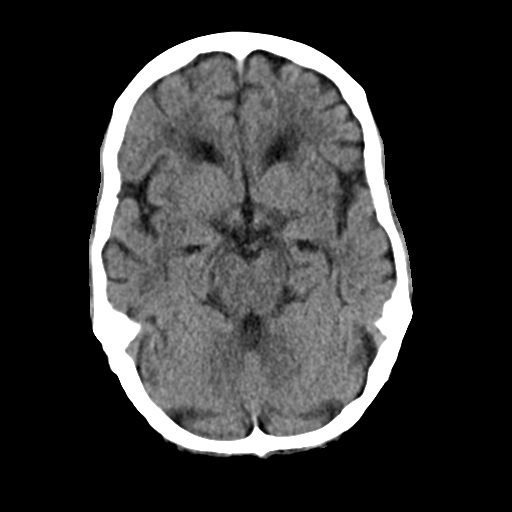
[im 14/33  brain]
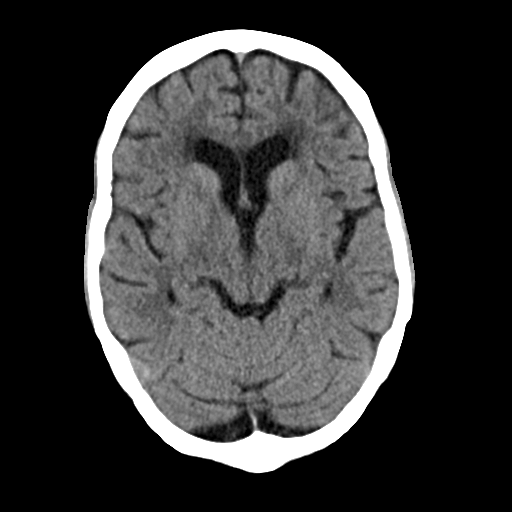
[im 16/33  brain]
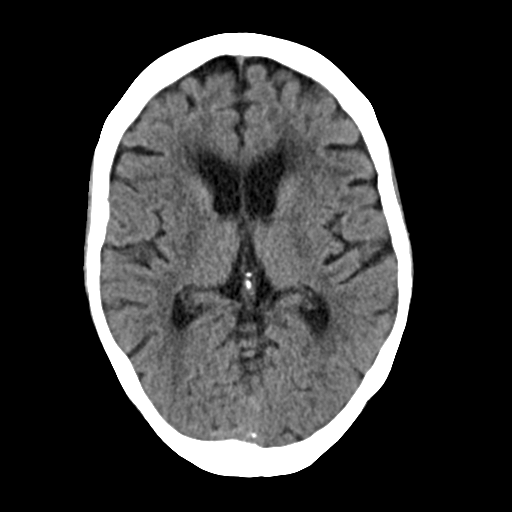
[im 17/33  brain]
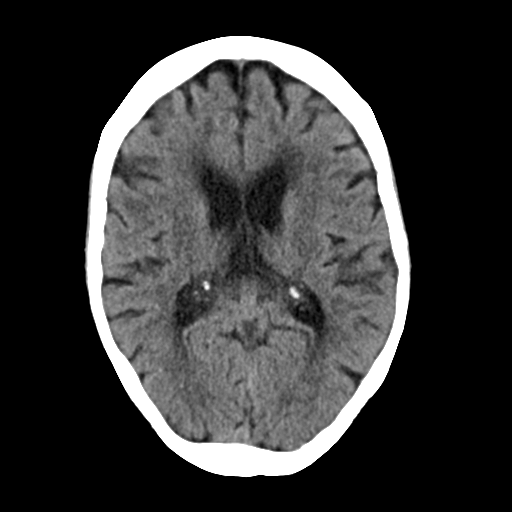
[im 17/33  bone]
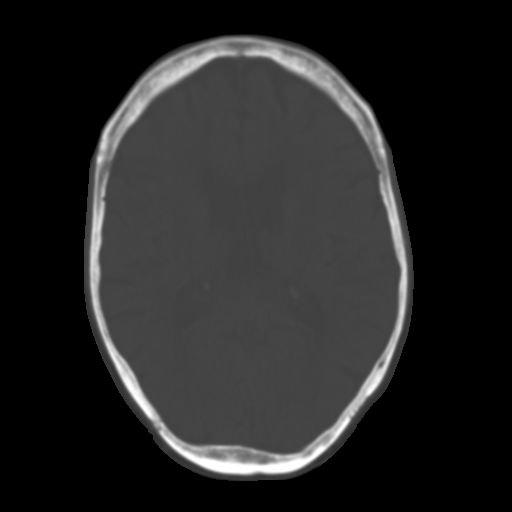
[im 19/33  brain]
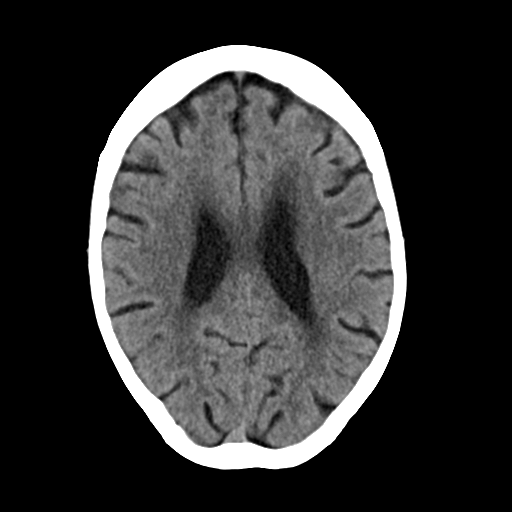
[im 21/33  brain]
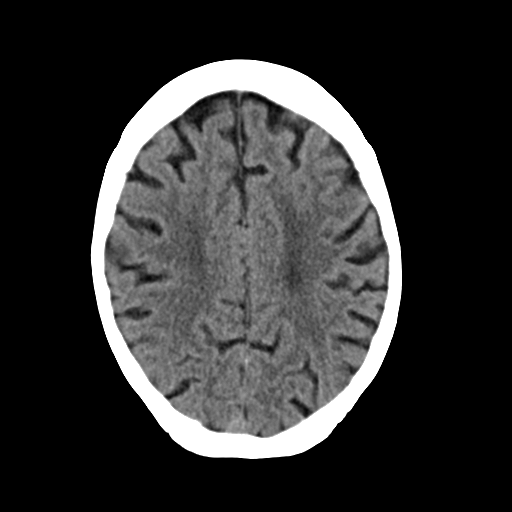
[im 24/33  brain]
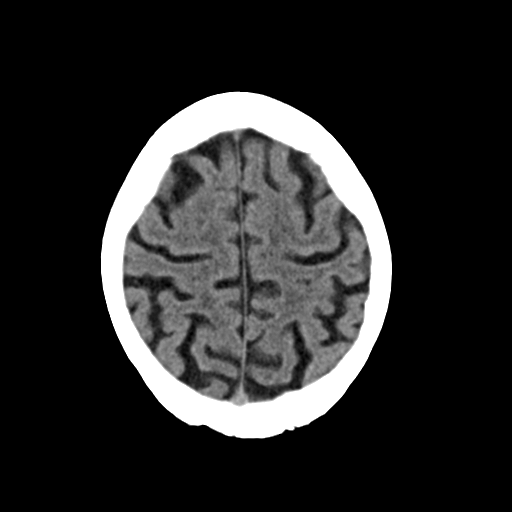
[im 25/33  brain]
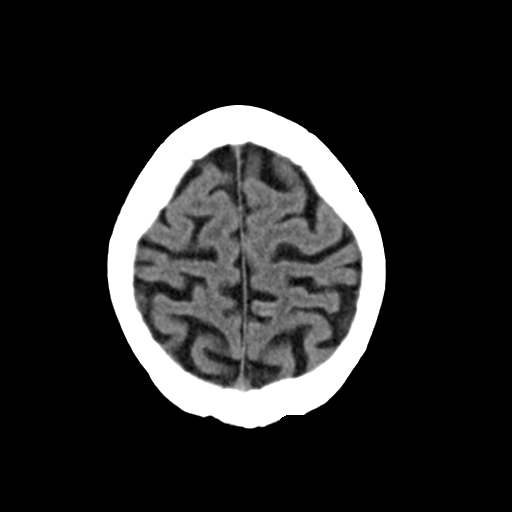
[im 25/33  bone]
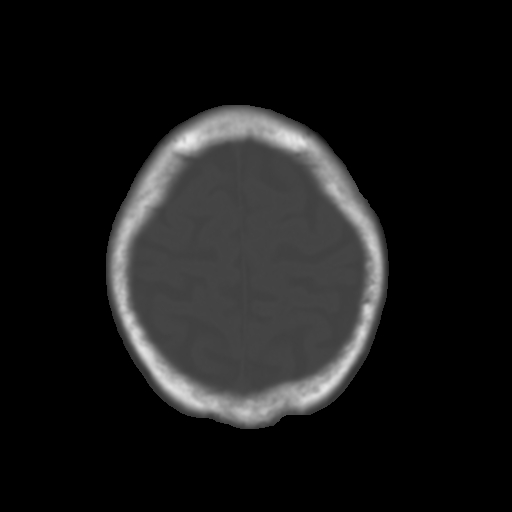
[im 27/33  brain]
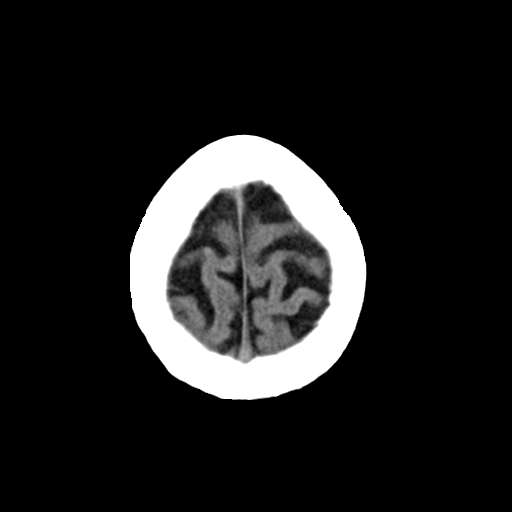
[im 29/33  brain]
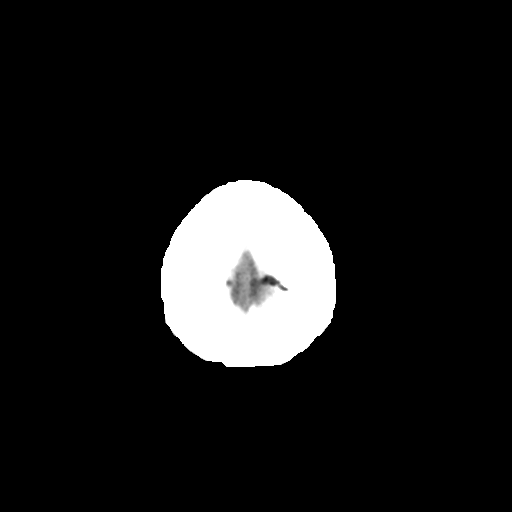
[im 31/33  brain]
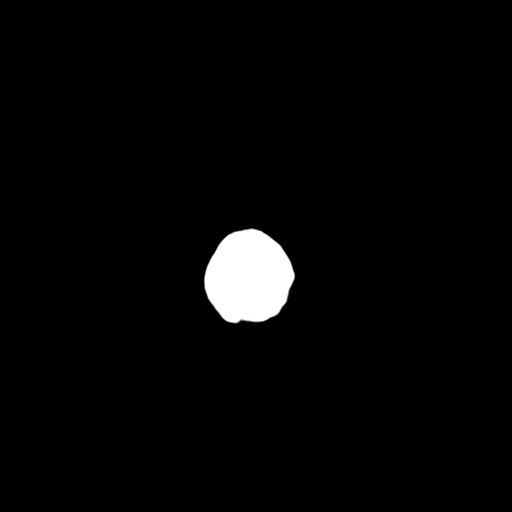

[16 of 30 positions shown; findings below may reference images not displayed]

FINDINGS: Brain: No evidence of acute infarction, hemorrhage, hydrocephalus,
extra-axial collection or mass lesion/mass effect. Generalized
atrophy.

Vascular: No hyperdense vessel or unexpected calcification.

Skull: Normal. Negative for fracture or focal lesion.

Sinuses/Orbits: Bilateral cataract resection
IMPRESSION: Aging brain without acute finding or specific explanation for
symptoms.

## 2021-01-01 DIAGNOSIS — Z79899 Other long term (current) drug therapy: Secondary | ICD-10-CM | POA: Diagnosis not present

## 2021-01-01 DIAGNOSIS — M069 Rheumatoid arthritis, unspecified: Secondary | ICD-10-CM | POA: Diagnosis not present

## 2021-01-01 DIAGNOSIS — I889 Nonspecific lymphadenitis, unspecified: Secondary | ICD-10-CM | POA: Diagnosis not present

## 2021-01-01 DIAGNOSIS — I10 Essential (primary) hypertension: Secondary | ICD-10-CM | POA: Diagnosis not present

## 2021-01-01 DIAGNOSIS — F439 Reaction to severe stress, unspecified: Secondary | ICD-10-CM | POA: Diagnosis not present

## 2021-01-01 DIAGNOSIS — D692 Other nonthrombocytopenic purpura: Secondary | ICD-10-CM | POA: Diagnosis not present

## 2021-01-02 ENCOUNTER — Ambulatory Visit (INDEPENDENT_AMBULATORY_CARE_PROVIDER_SITE_OTHER): Payer: Medicare Other | Admitting: Podiatry

## 2021-01-02 ENCOUNTER — Other Ambulatory Visit: Payer: Self-pay

## 2021-01-02 DIAGNOSIS — M25571 Pain in right ankle and joints of right foot: Secondary | ICD-10-CM

## 2021-01-02 MED ORDER — BETAMETHASONE SOD PHOS & ACET 6 (3-3) MG/ML IJ SUSP
6.0000 mg | Freq: Once | INTRAMUSCULAR | Status: AC
  Administered 2021-01-02: 6 mg

## 2021-01-02 NOTE — Progress Notes (Signed)
  Subjective:  Patient ID: Tonya Robinson, female    DOB: 1941-04-05,  MRN: [id number]  Chief Complaint  Patient presents with   Foot Swelling    Right ankle swelling and discomfort still present. Improved overall. Requests injection.    80 y.o. female presents with the above complaint. History confirmed with patient.   Objective:  Physical Exam: warm, good capillary refill, no trophic changes or ulcerative lesions, normal DP and PT pulses and normal sensory exam. Right Foot: Mod pain at the sinus tarsi. HAV with POP. Hammertoes present  Assessment:   1. Sinus tarsitis of right foot    Plan:  Patient was evaluated and treated and all questions answered.  Sinus Tarsitis -Repeat injection to STJ. Hold off further injection for at least 6 weeks.   Procedure: Injection Intermediate Joint Consent: Verbal consent obtained. Location: Right sinus tarsi. Skin Prep: alcohol. Injectate: 1 cc 0.5% marcaine plain, 1 cc betamethasone acetate-betamethasone sodium phosphate Disposition: Patient tolerated procedure well. Injection site dressed with a band-aid.    HAV, Hammertoes -Would benefit from surgical discussion for these issues. Hold off until RA under control. Likely would benefit from left 4th/5th correction at same time.  Return if symptoms worsen or fail to improve.

## 2021-01-09 DIAGNOSIS — L905 Scar conditions and fibrosis of skin: Secondary | ICD-10-CM | POA: Diagnosis not present

## 2021-01-13 DIAGNOSIS — Z961 Presence of intraocular lens: Secondary | ICD-10-CM | POA: Diagnosis not present

## 2021-01-13 DIAGNOSIS — H18459 Nodular corneal degeneration, unspecified eye: Secondary | ICD-10-CM | POA: Diagnosis not present

## 2021-01-13 DIAGNOSIS — H04123 Dry eye syndrome of bilateral lacrimal glands: Secondary | ICD-10-CM | POA: Diagnosis not present

## 2021-01-13 DIAGNOSIS — H35363 Drusen (degenerative) of macula, bilateral: Secondary | ICD-10-CM | POA: Diagnosis not present

## 2021-01-14 DIAGNOSIS — Z79899 Other long term (current) drug therapy: Secondary | ICD-10-CM | POA: Diagnosis not present

## 2021-01-14 DIAGNOSIS — R197 Diarrhea, unspecified: Secondary | ICD-10-CM | POA: Diagnosis not present

## 2021-01-14 DIAGNOSIS — M0609 Rheumatoid arthritis without rheumatoid factor, multiple sites: Secondary | ICD-10-CM | POA: Diagnosis not present

## 2021-01-15 DIAGNOSIS — Z96641 Presence of right artificial hip joint: Secondary | ICD-10-CM | POA: Diagnosis not present

## 2021-01-15 DIAGNOSIS — M1711 Unilateral primary osteoarthritis, right knee: Secondary | ICD-10-CM | POA: Diagnosis not present

## 2021-01-21 DIAGNOSIS — S81812A Laceration without foreign body, left lower leg, initial encounter: Secondary | ICD-10-CM | POA: Diagnosis not present

## 2021-02-01 DIAGNOSIS — S81811A Laceration without foreign body, right lower leg, initial encounter: Secondary | ICD-10-CM | POA: Diagnosis not present

## 2021-02-18 DIAGNOSIS — S51812A Laceration without foreign body of left forearm, initial encounter: Secondary | ICD-10-CM | POA: Diagnosis not present

## 2021-03-19 DIAGNOSIS — Z23 Encounter for immunization: Secondary | ICD-10-CM | POA: Diagnosis not present

## 2021-04-08 DIAGNOSIS — M353 Polymyalgia rheumatica: Secondary | ICD-10-CM | POA: Diagnosis not present

## 2021-04-08 DIAGNOSIS — Z681 Body mass index (BMI) 19 or less, adult: Secondary | ICD-10-CM | POA: Diagnosis not present

## 2021-04-08 DIAGNOSIS — R5383 Other fatigue: Secondary | ICD-10-CM | POA: Diagnosis not present

## 2021-04-08 DIAGNOSIS — M0579 Rheumatoid arthritis with rheumatoid factor of multiple sites without organ or systems involvement: Secondary | ICD-10-CM | POA: Diagnosis not present

## 2021-04-08 DIAGNOSIS — M2550 Pain in unspecified joint: Secondary | ICD-10-CM | POA: Diagnosis not present

## 2021-04-08 DIAGNOSIS — M150 Primary generalized (osteo)arthritis: Secondary | ICD-10-CM | POA: Diagnosis not present

## 2021-04-08 DIAGNOSIS — Z79899 Other long term (current) drug therapy: Secondary | ICD-10-CM | POA: Diagnosis not present

## 2021-04-28 ENCOUNTER — Other Ambulatory Visit: Payer: Self-pay

## 2021-04-28 ENCOUNTER — Ambulatory Visit (INDEPENDENT_AMBULATORY_CARE_PROVIDER_SITE_OTHER): Payer: Medicare Other | Admitting: Podiatry

## 2021-04-28 DIAGNOSIS — M25571 Pain in right ankle and joints of right foot: Secondary | ICD-10-CM | POA: Diagnosis not present

## 2021-04-28 MED ORDER — BETAMETHASONE SOD PHOS & ACET 6 (3-3) MG/ML IJ SUSP: 6.0000 mg | Freq: Once | INTRAMUSCULAR | Status: DC

## 2021-04-28 NOTE — Progress Notes (Signed)
  Subjective:  Patient ID: Tonya Robinson, female    DOB: 05-29-1941,  MRN: [id number]  No chief complaint on file.   80 y.o. female presents with the above complaint. History confirmed with patient. States that the injection helps but they do not last very long. Having some issues with her RA and s/e of her Humira. Denies other pedal issues.  Objective:  Physical Exam: warm, good capillary refill, no trophic changes or ulcerative lesions, normal DP and PT pulses and normal sensory exam. Right Foot: Mod pain at the sinus tarsi. HAV with POP. Hammertoes present  Assessment:   1. Sinus tarsitis of right foot   2. Sinus tarsi syndrome, right    Plan:  Patient was evaluated and treated and all questions answered.  Sinus Tarsitis -Repeat injection right sinus tarsi  Procedure: Injection Intermediate Joint Consent: Verbal consent obtained. Location: Right sinus tarsi. Skin Prep: alcohol. Injectate: 1 cc 0.5% marcaine plain, 1 cc betamethasone acetate-betamethasone sodium phosphate Disposition: Patient tolerated procedure well. Injection site dressed with a band-aid.  HAV, Hammertoes -Would benefit from surgical discussion for these issues at a later date. Hold off until RA under control. Likely would benefit from left 4th/5th correction at same time.  No follow-ups on file.

## 2021-05-04 DIAGNOSIS — I10 Essential (primary) hypertension: Secondary | ICD-10-CM | POA: Diagnosis not present

## 2021-05-04 DIAGNOSIS — D692 Other nonthrombocytopenic purpura: Secondary | ICD-10-CM | POA: Diagnosis not present

## 2021-05-04 DIAGNOSIS — F439 Reaction to severe stress, unspecified: Secondary | ICD-10-CM | POA: Diagnosis not present

## 2021-05-04 DIAGNOSIS — R151 Fecal smearing: Secondary | ICD-10-CM | POA: Diagnosis not present

## 2021-05-04 DIAGNOSIS — R8290 Unspecified abnormal findings in urine: Secondary | ICD-10-CM | POA: Diagnosis not present

## 2021-05-04 DIAGNOSIS — R350 Frequency of micturition: Secondary | ICD-10-CM | POA: Diagnosis not present

## 2021-05-04 DIAGNOSIS — M069 Rheumatoid arthritis, unspecified: Secondary | ICD-10-CM | POA: Diagnosis not present

## 2021-05-04 DIAGNOSIS — Z79899 Other long term (current) drug therapy: Secondary | ICD-10-CM | POA: Diagnosis not present

## 2021-05-06 DIAGNOSIS — K629 Disease of anus and rectum, unspecified: Secondary | ICD-10-CM | POA: Diagnosis not present

## 2021-05-11 ENCOUNTER — Other Ambulatory Visit: Payer: Self-pay

## 2021-05-11 ENCOUNTER — Ambulatory Visit: Payer: Medicare Other | Admitting: Cardiology

## 2021-05-11 ENCOUNTER — Encounter: Payer: Self-pay | Admitting: Cardiology

## 2021-05-11 VITALS — BP 123/59 | HR 70 | Temp 98.1°F | Resp 18 | Ht 61.0 in | Wt 95.6 lb

## 2021-05-11 DIAGNOSIS — I10 Essential (primary) hypertension: Secondary | ICD-10-CM

## 2021-05-11 DIAGNOSIS — I351 Nonrheumatic aortic (valve) insufficiency: Secondary | ICD-10-CM | POA: Diagnosis not present

## 2021-05-11 NOTE — Progress Notes (Signed)
Primary Physician/Referring:  Vernie Shanks, MD  Patient ID: Tonya Robinson, female    DOB: 1941/01/17, 80 y.o.   MRN: [id number]  No chief complaint on file.  HPI:    Tonya Robinson  is a 80 y.o. Caucasian female with polymyalgia rheumatica, fibromyalgia, Raynaud's disease, mild hyperlipidemia, hypertension, rheumatoid arthritis, chronic mild dyspnea on exertion, moderate aortic regurgitation, normal coronary arteries in 2019 presents here for annual visit.   She is doing well and no significant change in her symptoms.  No PND or orthopnea, no leg edema.  She is essentially asymptomatic.  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 disease ([facility]) RIGHT HIP   Past Surgical History:  Procedure Laterality Date   ABDOMINAL HYSTERECTOMY     BREAST LUMPECTOMY Right    KNEE CARTILAGE SURGERY Right    RIGHT/LEFT HEART CATH AND CORONARY ANGIOGRAPHY N/A 07/08/2017   Procedure: RIGHT/LEFT HEART CATH AND CORONARY ANGIOGRAPHY;  Surgeon: Adrian Prows, MD;  Location: [facility];  Service: Cardiovascular;  Laterality: N/A;   Family History  Problem Relation Age of Onset   Hypertension Mother    Arthritis Mother    Heart disease Father    Arthritis Father    Heart failure Brother    Stroke Brother     Social History   Tobacco Use   Smoking status: Former    Packs/day: 0.25    Years: 15.00    Pack years: 3.75    Types: Cigarettes   Smokeless tobacco: Never  Substance Use Topics   Alcohol use: Yes    Alcohol/week: 1.0 standard drink    Types: 1 Glasses of wine per week    Comment: occasionally   Marital Status: Divorced  ROS  Review of Systems  Cardiovascular:  Positive for dyspnea on exertion. Negative for chest pain and leg swelling.  Gastrointestinal:  Negative for melena.  Neurological:  Positive for  paresthesias (upper extremity).  Objective  Blood pressure (!) 123/59, pulse 70, temperature 98.1 F (36.7 C), temperature source Temporal, resp. rate 18, height 5\' 1"  (1.549 m), weight 95 lb 9.6 oz (43.4 kg), SpO2 100 %.  Vitals with BMI 05/11/2021 05/09/2020 04/03/2020  Height 5\' 1"  5\' 1"  5\' 1"   Weight 95 lbs 10 oz 94 lbs 98 lbs  BMI 18.07 62.13 08.65  Systolic 784 696 295  Diastolic 59 61 54  Pulse 70 87 72     Physical Exam Cardiovascular:     Rate and Rhythm: Normal rate and regular rhythm.     Pulses: Intact distal pulses.     Heart sounds: Murmur heard.  High-pitched blowing midsystolic murmur is present at the apex.  High-pitched blowing decrescendo early diastolic murmur is present with a grade of 2/4 at the upper right sternal border radiating to the apex.    No gallop.     Comments: No leg edema, no JVD. Pulmonary:     Effort: Pulmonary effort is normal.     Breath sounds: Normal breath sounds.  Abdominal:     General: Bowel sounds are normal.     Palpations: Abdomen is soft.   Laboratory examination:   External labs:   Labs 05/04/2021: Hb 12.3/HCT 36.3, platelets 261, normal indicis.  Serum glucose 99, BUN 12, creatinine 0.81, potassium 4.5.  TSH  normal at 2.92.  Cholesterol, total 224.000 m 11/01/2017 HDL 103.000 m 11/01/2017 LDL 109.000 m 11/01/2017 Triglycerides 59.000 mg 11/01/2017  Medications and allergies   Allergies  Allergen Reactions   Valtrex [Valacyclovir]    Other Other (See Comments)   Penicillins Rash    Childhood allergy Has patient had a PCN reaction causing immediate rash, facial/tongue/throat swelling, SOB or lightheadedness with hypotension: Yes Has patient had a PCN reaction causing severe rash involving mucus membranes or skin necrosis: No Has patient had a PCN reaction that required hospitalization: No Has patient had a PCN reaction occurring within the last 10 years: No If all of the above answers are "NO", then may proceed with  Cephalosporin use.      Outpatient Medications Prior to Visit  Medication Sig Dispense Refill   Adalimumab 40 MG/0.4ML PSKT Inject 40 mg into the skin every 14 (fourteen) days. On fridays     ALPRAZolam (XANAX) 0.25 MG tablet Take 1 tab tid prn anxiety and hs prn insomnia. (Patient taking differently: Take 0.25 mg by mouth daily as needed for anxiety.) 60 tablet 0   amLODipine (NORVASC) 2.5 MG tablet Take 2.5 mg by mouth daily.     Calcium Carbonate-Vitamin D (CALCIUM 600+D PO) Take 1 tablet by mouth 3 (three) times daily.     erythromycin (ERY-TAB) 500 MG EC tablet Take 1,000 mg by mouth See admin instructions. Take 1000 mg 1 hour prior to dental procedures     FLUoxetine (PROZAC) 10 MG capsule TAKE ONE CAPSULE BY MOUTH ONCE DAILY 90 capsule 0   folic acid (FOLVITE) 1 MG tablet Take 2 mg by mouth daily.     furosemide (LASIX) 20 MG tablet Take 20 mg by mouth as needed.     ibuprofen (ADVIL,MOTRIN) 200 MG tablet Take 400 mg by mouth 3 (three) times daily.     levothyroxine (SYNTHROID, LEVOTHROID) 25 MCG tablet Take 1 tablet (25 mcg total) by mouth daily before breakfast. 90 tablet 2   methotrexate (RHEUMATREX) 2.5 MG tablet Take 20 mg by mouth every Monday. Caution:Chemotherapy. Protect from light. Takes 8 tablets weekly      metoprolol succinate (TOPROL-XL) 25 MG 24 hr tablet Take 12.5 mg by mouth daily.      Omega-3 Fatty Acids (FISH OIL) 1200 MG CAPS Take 1,200 mg by mouth daily.     predniSONE (DELTASONE) 1 MG tablet Take 3 mg by mouth daily.      No facility-administered medications prior to visit.    Radiology:   No results found.  Cardiac Studies:   Nuclear stress test   [01/07/2017]: 01/07/2017 1. The resting electrocardiogram demonstrated normal sinus rhythm, normal resting conduction, no resting arrhythmias and normal rest repolarization. The stress electrocardiogram was positive for ischemia with 2 mm horizontal ST depression noed at peak exercise persisting for > 2 minutes  into recovery. Stress symptoms included dyspnea and chest pressure. Patient exercised on Bruce protocol for 7:00 minutes and achieved 8.55 METS. Stress test terminated due to chest pressure, dyspnea and 88% MPHR achieved (Target HR >85%). 2. Myocardial perfusion imaging is normal. Overall left ventricular systolic function was normal without regional wall motion abnormalities. The left ventricular ejection fraction was 80%. This is an intermediate risk study, clinical correlation recommended in view of symptoms and abnormal EKG.  Left and right heart catheterization 07/08/2017:  Normal coronary arteries and right heart pressure.  Echocardiogram 04/08/2020: Normal LV systolic function with visual EF 60-65%. Left ventricle cavity is normal in size. Normal global  wall motion. Normal diastolic filling pattern, normal LAP.  Right atrial cavity is normal in size. An echo density noted in the right atrium at the level of the tricuspid best seen in RV inflow and subcostal view (differential includes: reverberation artifact, vegetation).  Moderate (Grade III) aortic regurgitation. Mild (Grade I) mitral regurgitation. Mild tricuspid regurgitation. Insignificant pericardial effusion. Recommendation: Consider transesophageal echocardiogram to re-evaluate the RA and RV.  Compared to prior echo 01/18/2017: AR is now moderate no other significant change.  Personally reviewed the echocardiogram, the density seen in the right atrium is just mitral and calcification and mild tricuspid annular calcification reverberation artifact.  No indication for TEE.  EKG:   EKG 05/11/2021: Normal sinus rhythm at rate of 74 bpm, normal EKG.  No change from 04/03/2020.     Assessment     ICD-10-CM   1. Moderate aortic regurgitation  I35.1 EKG 12-Lead    PCV ECHOCARDIOGRAM COMPLETE    2. Primary hypertension  I10       There are no discontinued medications.   No orders of the defined types were placed in this  encounter.  Recommendations:   Tonya Robinson is a 80 y.o. Caucasian female with polymyalgia rheumatica, fibromyalgia, Raynaud's disease, mild hyperlipidemia, hypertension, rheumatoid arthritis, chronic mild dyspnea on exertion, moderate aortic regurgitation, normal coronary arteries in 2019 presents here for annual visit.   She is doing well and no significant change in her symptoms.  No PND or orthopnea, no leg edema.  She is completely asymptomatic, no clinical evidence heart failure.  I did not make any changes to her medication.  Blood pressures well controlled.  I will see her back in 1 year or sooner if she has a problem with repeat echocardiogram.  I reviewed her external labs, normal renal function and normal CBC, lipids done in 2019 had revealed excellent lipid profile.   Adrian Prows, MD, [facility] 05/11/2021, 10:40 AM Office: [phone]

## 2021-05-25 DIAGNOSIS — Z1389 Encounter for screening for other disorder: Secondary | ICD-10-CM | POA: Diagnosis not present

## 2021-05-25 DIAGNOSIS — Z0000 Encounter for general adult medical examination without abnormal findings: Secondary | ICD-10-CM | POA: Diagnosis not present

## 2021-06-02 DIAGNOSIS — K629 Disease of anus and rectum, unspecified: Secondary | ICD-10-CM | POA: Diagnosis not present

## 2021-06-09 DIAGNOSIS — R350 Frequency of micturition: Secondary | ICD-10-CM | POA: Diagnosis not present

## 2021-06-25 DIAGNOSIS — M25561 Pain in right knee: Secondary | ICD-10-CM | POA: Diagnosis not present

## 2021-06-25 DIAGNOSIS — M545 Low back pain, unspecified: Secondary | ICD-10-CM | POA: Diagnosis not present

## 2021-06-25 DIAGNOSIS — Z79899 Other long term (current) drug therapy: Secondary | ICD-10-CM | POA: Diagnosis not present

## 2021-06-25 DIAGNOSIS — M0609 Rheumatoid arthritis without rheumatoid factor, multiple sites: Secondary | ICD-10-CM | POA: Diagnosis not present

## 2021-06-30 ENCOUNTER — Ambulatory Visit: Payer: Medicare Other | Admitting: Podiatry

## 2021-07-05 DIAGNOSIS — S51811A Laceration without foreign body of right forearm, initial encounter: Secondary | ICD-10-CM | POA: Diagnosis not present

## 2021-07-07 DIAGNOSIS — M5136 Other intervertebral disc degeneration, lumbar region: Secondary | ICD-10-CM | POA: Diagnosis not present

## 2021-07-07 DIAGNOSIS — M1990 Unspecified osteoarthritis, unspecified site: Secondary | ICD-10-CM | POA: Diagnosis not present

## 2021-07-07 DIAGNOSIS — M11269 Other chondrocalcinosis, unspecified knee: Secondary | ICD-10-CM | POA: Diagnosis not present

## 2021-07-07 DIAGNOSIS — S32000A Wedge compression fracture of unspecified lumbar vertebra, initial encounter for closed fracture: Secondary | ICD-10-CM | POA: Diagnosis not present

## 2021-07-08 ENCOUNTER — Other Ambulatory Visit: Payer: Self-pay | Admitting: Family Medicine

## 2021-07-08 DIAGNOSIS — M11269 Other chondrocalcinosis, unspecified knee: Secondary | ICD-10-CM

## 2021-07-08 DIAGNOSIS — M1990 Unspecified osteoarthritis, unspecified site: Secondary | ICD-10-CM

## 2021-07-08 DIAGNOSIS — M8580 Other specified disorders of bone density and structure, unspecified site: Secondary | ICD-10-CM

## 2021-07-14 DIAGNOSIS — L814 Other melanin hyperpigmentation: Secondary | ICD-10-CM | POA: Diagnosis not present

## 2021-07-14 DIAGNOSIS — L821 Other seborrheic keratosis: Secondary | ICD-10-CM | POA: Diagnosis not present

## 2021-07-14 DIAGNOSIS — L988 Other specified disorders of the skin and subcutaneous tissue: Secondary | ICD-10-CM | POA: Diagnosis not present

## 2021-07-14 DIAGNOSIS — L570 Actinic keratosis: Secondary | ICD-10-CM | POA: Diagnosis not present

## 2021-07-14 DIAGNOSIS — D692 Other nonthrombocytopenic purpura: Secondary | ICD-10-CM | POA: Diagnosis not present

## 2021-07-15 ENCOUNTER — Other Ambulatory Visit: Payer: Self-pay

## 2021-07-15 ENCOUNTER — Ambulatory Visit
Admission: RE | Admit: 2021-07-15 | Discharge: 2021-07-15 | Disposition: A | Discharge status: Home / Self Care | Payer: Medicare Other | Source: Ambulatory Visit | Attending: Family Medicine | Admitting: Family Medicine

## 2021-07-15 DIAGNOSIS — M25561 Pain in right knee: Secondary | ICD-10-CM | POA: Diagnosis not present

## 2021-07-15 DIAGNOSIS — M11269 Other chondrocalcinosis, unspecified knee: Secondary | ICD-10-CM

## 2021-07-15 DIAGNOSIS — M1990 Unspecified osteoarthritis, unspecified site: Secondary | ICD-10-CM

## 2021-07-15 DIAGNOSIS — M8580 Other specified disorders of bone density and structure, unspecified site: Secondary | ICD-10-CM

## 2021-07-15 IMAGING — MR MR KNEE*R* W/O CM
4 of 7 series · 22 of 40 positions shown · non-contrast
Comparison: None.

CLINICAL DATA: Generalized right knee pain for 1 year, but patient
reports pain is worsening.

EXAM:
MRI OF THE RIGHT KNEE WITHOUT CONTRAST
TECHNIQUE: Multiplanar, multisequence MR imaging of the knee was performed. No
intravenous contrast was administered.

[Series 3: T2 fat-sat · axial · 4.0mm · 0.50mm/px · z∈[-72,+43]mm · 5 of 24 slices shown]
[im 1/24]
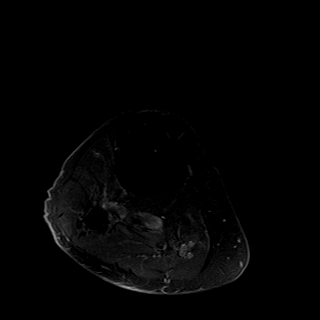
[im 6/24]
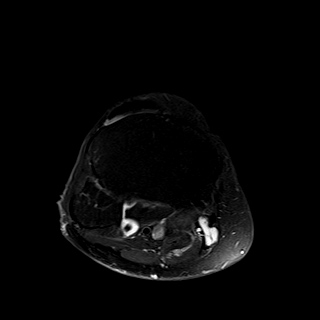
[im 12/24]
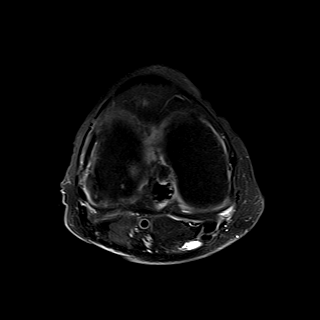
[im 18/24]
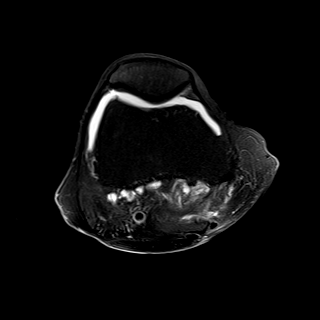
[im 24/24]
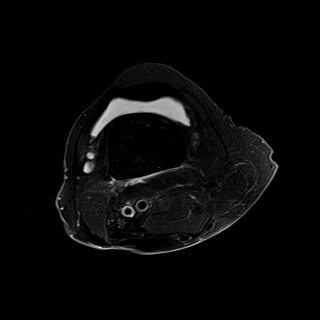

[Series 7: PD fat-sat · sagittal · 3.0mm · 0.29mm/px · 7 of 25 slices shown (1 of 3)]
[im 1/25]
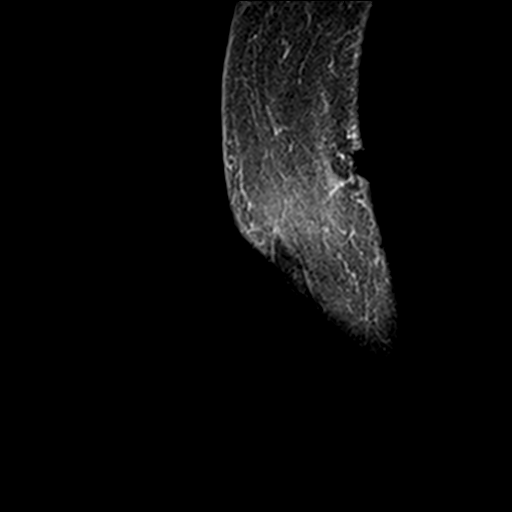
[im 5/25]
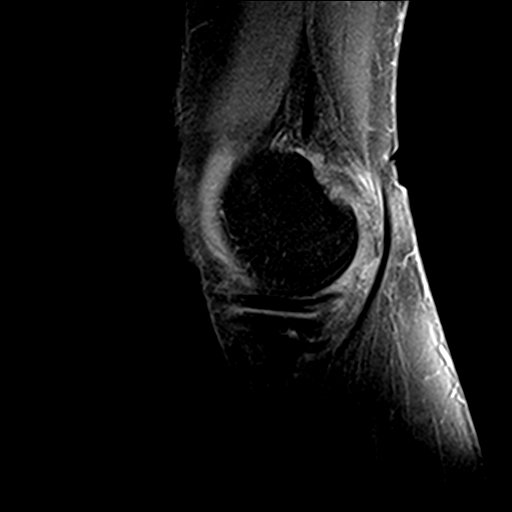
[im 9/25]
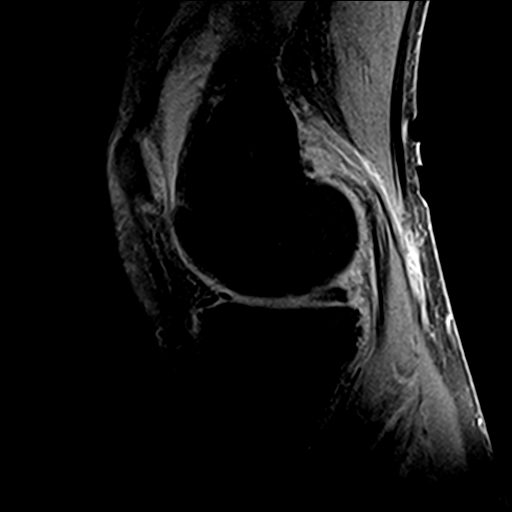
[im 13/25]
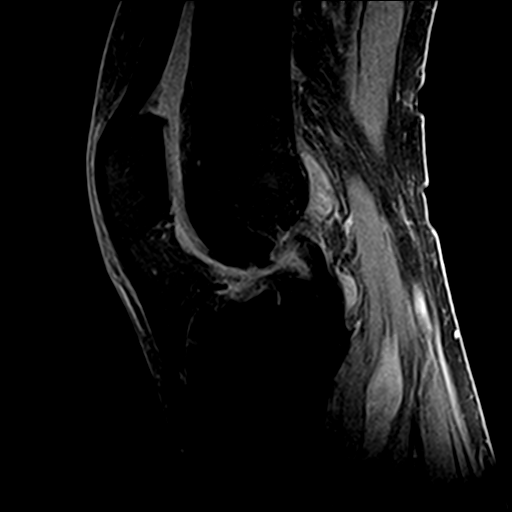
[im 17/25]
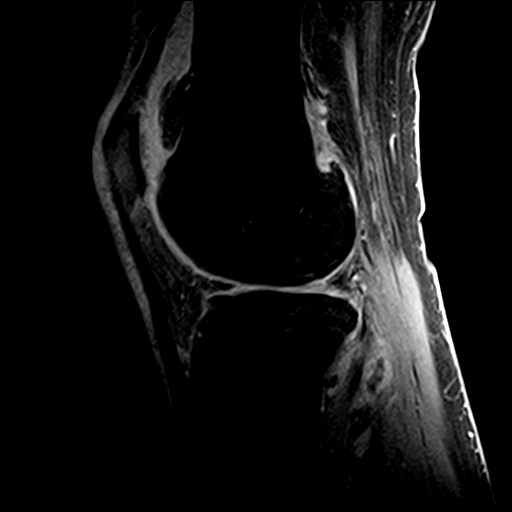
[im 21/25]
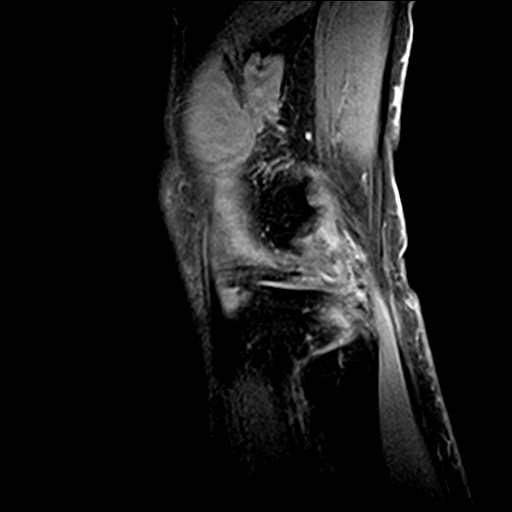
[im 25/25]
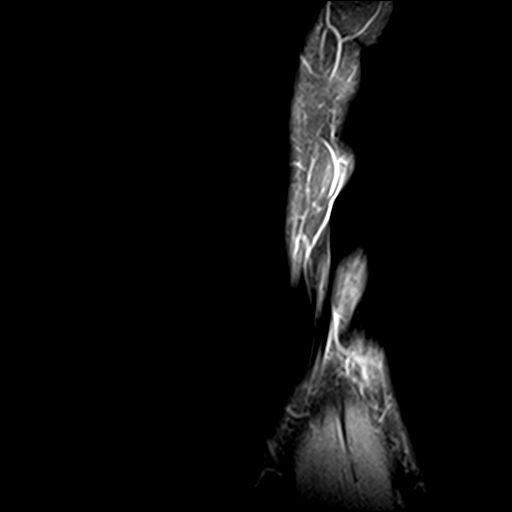

[Series 8: PD fat-sat · coronal · 3.0mm · 0.29mm/px · 7 of 25 slices shown (2 of 3)]
[im 1/25]
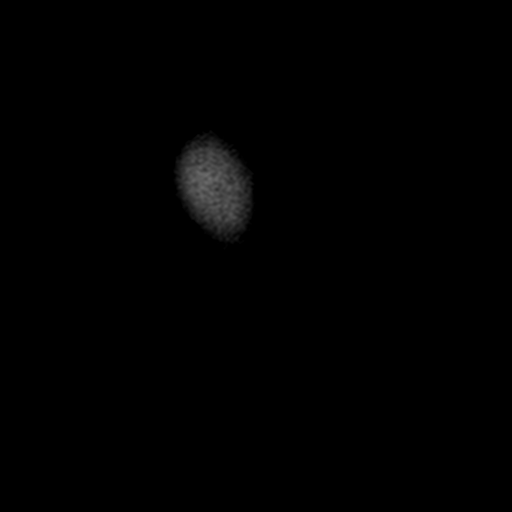
[im 5/25]
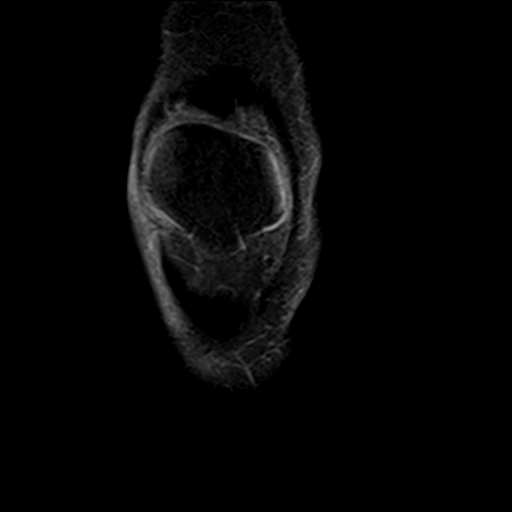
[im 9/25]
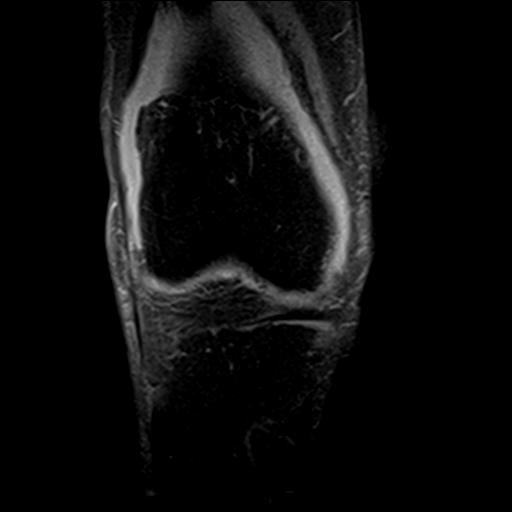
[im 13/25]
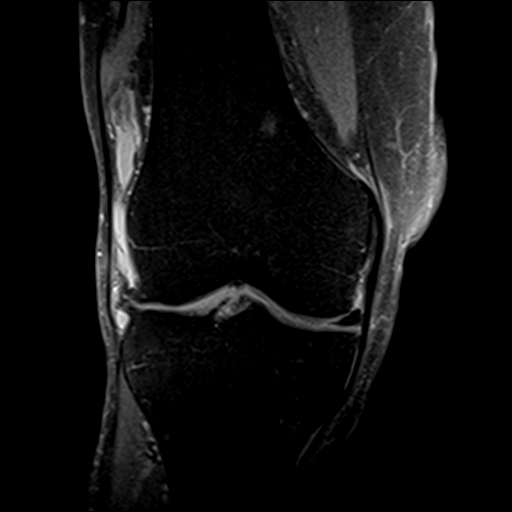
[im 17/25]
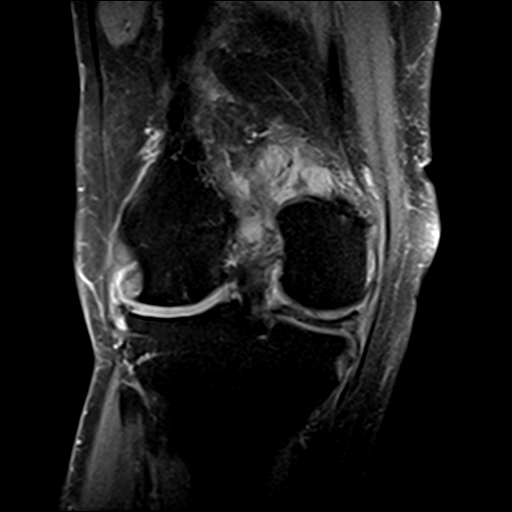
[im 21/25]
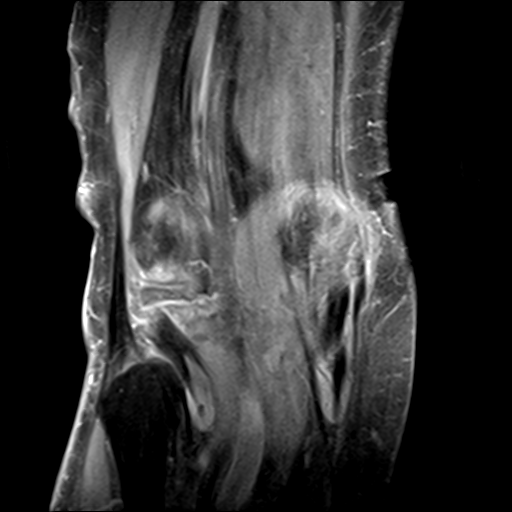
[im 25/25]
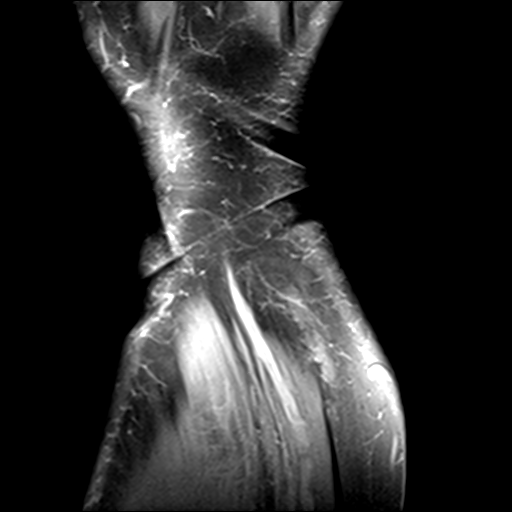

[Series 9: PD fat-sat · oblique · 2.3mm · 0.29mm/px · 3 of 11 slices shown (3 of 3)]
[im 1/11]
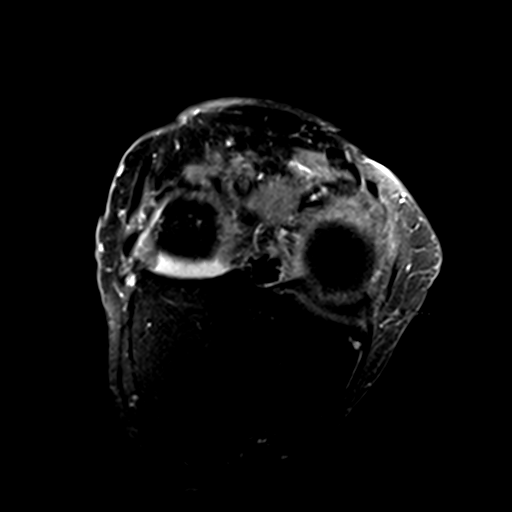
[im 6/11]
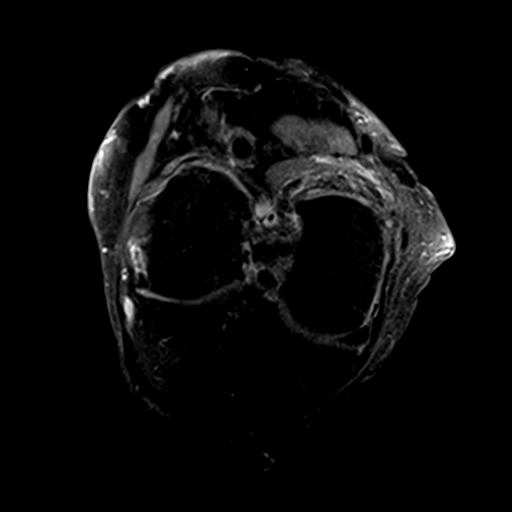
[im 11/11]
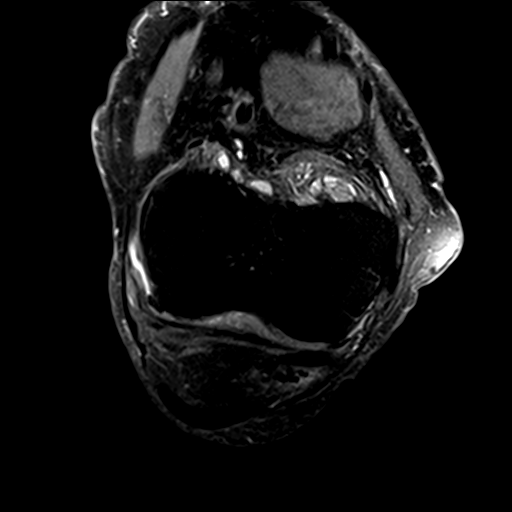

[22 of 40 positions shown; findings below may reference images not displayed]

FINDINGS: MENISCI

Medial meniscus: Intrasubstance degeneration with free edge and
undersurface fraying of the medial meniscal body and posterior horn.

Lateral meniscus: Extensive complex tearing/maceration of the
lateral meniscus.

LIGAMENTS

Cruciates: Nonvisualization of the ACL, likely chronically torn.
Intact PCL.

Collaterals: Intact MCL. Lateral collateral ligament complex intact.

CARTILAGE

Patellofemoral:  No chondral defect.

Medial: Mild chondral thinning and surface irregularity of the
weight-bearing medial compartment.

Lateral: Extensive full-thickness cartilage loss throughout the
lateral compartment.

MISCELLANEOUS

Joint: Moderate sized joint effusion. 9 mm loose body within the
subpopliteal joint recess. Fat pads within normal limits.

Popliteal Fossa: Moderate sized Baker's cyst. Intact popliteus
tendon.

Extensor Mechanism:  Intact quadriceps and patellar tendons.

Bones: No acute fracture. No dislocation. Lateral compartment joint
space loss with marginal osteophyte formation. No marrow replacing
bone lesion.

Other: No significant periarticular soft tissue findings.
IMPRESSION: 1. Severe lateral compartment osteoarthritis of the right knee.
2. Extensive complex tearing/maceration of the lateral meniscus.
3. Degeneration and fraying of the medial meniscus.
4. Nonvisualization of the ACL, likely chronically torn.
5. Moderate sized joint effusion.
6. Moderate sized Baker's cyst.

## 2021-07-16 DIAGNOSIS — N3946 Mixed incontinence: Secondary | ICD-10-CM | POA: Diagnosis not present

## 2021-07-16 DIAGNOSIS — R350 Frequency of micturition: Secondary | ICD-10-CM | POA: Diagnosis not present

## 2021-07-16 DIAGNOSIS — R351 Nocturia: Secondary | ICD-10-CM | POA: Diagnosis not present

## 2021-07-17 DIAGNOSIS — Z1231 Encounter for screening mammogram for malignant neoplasm of breast: Secondary | ICD-10-CM | POA: Diagnosis not present

## 2021-07-29 DIAGNOSIS — M47896 Other spondylosis, lumbar region: Secondary | ICD-10-CM | POA: Diagnosis not present

## 2021-07-29 DIAGNOSIS — M4316 Spondylolisthesis, lumbar region: Secondary | ICD-10-CM | POA: Diagnosis not present

## 2021-07-29 DIAGNOSIS — M545 Low back pain, unspecified: Secondary | ICD-10-CM | POA: Diagnosis not present

## 2021-07-29 DIAGNOSIS — M25561 Pain in right knee: Secondary | ICD-10-CM | POA: Diagnosis not present

## 2021-07-29 DIAGNOSIS — M179 Osteoarthritis of knee, unspecified: Secondary | ICD-10-CM | POA: Diagnosis not present

## 2021-07-31 DIAGNOSIS — H6122 Impacted cerumen, left ear: Secondary | ICD-10-CM | POA: Diagnosis not present

## 2021-07-31 DIAGNOSIS — H6502 Acute serous otitis media, left ear: Secondary | ICD-10-CM | POA: Diagnosis not present

## 2021-08-12 DIAGNOSIS — E079 Disorder of thyroid, unspecified: Secondary | ICD-10-CM | POA: Diagnosis not present

## 2021-08-12 DIAGNOSIS — R350 Frequency of micturition: Secondary | ICD-10-CM | POA: Diagnosis not present

## 2021-08-12 DIAGNOSIS — Z90710 Acquired absence of both cervix and uterus: Secondary | ICD-10-CM | POA: Diagnosis not present

## 2021-08-12 DIAGNOSIS — M069 Rheumatoid arthritis, unspecified: Secondary | ICD-10-CM | POA: Diagnosis not present

## 2021-08-12 DIAGNOSIS — I10 Essential (primary) hypertension: Secondary | ICD-10-CM | POA: Diagnosis not present

## 2021-08-12 DIAGNOSIS — Z01419 Encounter for gynecological examination (general) (routine) without abnormal findings: Secondary | ICD-10-CM | POA: Diagnosis not present

## 2021-08-12 DIAGNOSIS — M810 Age-related osteoporosis without current pathological fracture: Secondary | ICD-10-CM | POA: Diagnosis not present

## 2021-08-12 DIAGNOSIS — M797 Fibromyalgia: Secondary | ICD-10-CM | POA: Diagnosis not present

## 2021-08-17 DIAGNOSIS — M0579 Rheumatoid arthritis with rheumatoid factor of multiple sites without organ or systems involvement: Secondary | ICD-10-CM | POA: Diagnosis not present

## 2021-08-17 DIAGNOSIS — R5383 Other fatigue: Secondary | ICD-10-CM | POA: Diagnosis not present

## 2021-08-26 DIAGNOSIS — N3946 Mixed incontinence: Secondary | ICD-10-CM | POA: Diagnosis not present

## 2021-08-28 DIAGNOSIS — S51012A Laceration without foreign body of left elbow, initial encounter: Secondary | ICD-10-CM | POA: Diagnosis not present

## 2021-08-30 DIAGNOSIS — S51012D Laceration without foreign body of left elbow, subsequent encounter: Secondary | ICD-10-CM | POA: Diagnosis not present

## 2021-09-01 DIAGNOSIS — S80211A Abrasion, right knee, initial encounter: Secondary | ICD-10-CM | POA: Diagnosis not present

## 2021-09-01 DIAGNOSIS — S80212A Abrasion, left knee, initial encounter: Secondary | ICD-10-CM | POA: Diagnosis not present

## 2021-09-01 DIAGNOSIS — S51812D Laceration without foreign body of left forearm, subsequent encounter: Secondary | ICD-10-CM | POA: Diagnosis not present

## 2021-09-01 DIAGNOSIS — F439 Reaction to severe stress, unspecified: Secondary | ICD-10-CM | POA: Diagnosis not present

## 2021-09-08 DIAGNOSIS — S80211A Abrasion, right knee, initial encounter: Secondary | ICD-10-CM | POA: Diagnosis not present

## 2021-09-08 DIAGNOSIS — S51812D Laceration without foreign body of left forearm, subsequent encounter: Secondary | ICD-10-CM | POA: Diagnosis not present

## 2021-09-08 DIAGNOSIS — S80212A Abrasion, left knee, initial encounter: Secondary | ICD-10-CM | POA: Diagnosis not present

## 2021-09-15 DIAGNOSIS — S80211A Abrasion, right knee, initial encounter: Secondary | ICD-10-CM | POA: Diagnosis not present

## 2021-09-15 DIAGNOSIS — S80212A Abrasion, left knee, initial encounter: Secondary | ICD-10-CM | POA: Diagnosis not present

## 2021-09-15 DIAGNOSIS — S51812D Laceration without foreign body of left forearm, subsequent encounter: Secondary | ICD-10-CM | POA: Diagnosis not present

## 2021-09-22 DIAGNOSIS — M8589 Other specified disorders of bone density and structure, multiple sites: Secondary | ICD-10-CM | POA: Diagnosis not present

## 2021-09-22 DIAGNOSIS — Z79899 Other long term (current) drug therapy: Secondary | ICD-10-CM | POA: Diagnosis not present

## 2021-09-22 DIAGNOSIS — M1990 Unspecified osteoarthritis, unspecified site: Secondary | ICD-10-CM | POA: Diagnosis not present

## 2021-09-22 DIAGNOSIS — M0609 Rheumatoid arthritis without rheumatoid factor, multiple sites: Secondary | ICD-10-CM | POA: Diagnosis not present

## 2021-10-07 DIAGNOSIS — M1991 Primary osteoarthritis, unspecified site: Secondary | ICD-10-CM | POA: Diagnosis not present

## 2021-10-07 DIAGNOSIS — Z79899 Other long term (current) drug therapy: Secondary | ICD-10-CM | POA: Diagnosis not present

## 2021-10-07 DIAGNOSIS — M353 Polymyalgia rheumatica: Secondary | ICD-10-CM | POA: Diagnosis not present

## 2021-10-07 DIAGNOSIS — Z681 Body mass index (BMI) 19 or less, adult: Secondary | ICD-10-CM | POA: Diagnosis not present

## 2021-10-07 DIAGNOSIS — M0579 Rheumatoid arthritis with rheumatoid factor of multiple sites without organ or systems involvement: Secondary | ICD-10-CM | POA: Diagnosis not present

## 2021-10-08 DIAGNOSIS — S80211A Abrasion, right knee, initial encounter: Secondary | ICD-10-CM | POA: Diagnosis not present

## 2021-10-08 DIAGNOSIS — S80212A Abrasion, left knee, initial encounter: Secondary | ICD-10-CM | POA: Diagnosis not present

## 2021-10-08 DIAGNOSIS — S51812D Laceration without foreign body of left forearm, subsequent encounter: Secondary | ICD-10-CM | POA: Diagnosis not present

## 2021-11-24 ENCOUNTER — Encounter: Payer: Self-pay | Admitting: Podiatry

## 2021-11-24 ENCOUNTER — Ambulatory Visit (INDEPENDENT_AMBULATORY_CARE_PROVIDER_SITE_OTHER): Payer: Medicare Other | Admitting: Podiatry

## 2021-11-24 ENCOUNTER — Ambulatory Visit (INDEPENDENT_AMBULATORY_CARE_PROVIDER_SITE_OTHER): Payer: Medicare Other

## 2021-11-24 DIAGNOSIS — M069 Rheumatoid arthritis, unspecified: Secondary | ICD-10-CM | POA: Diagnosis not present

## 2021-11-24 DIAGNOSIS — M25571 Pain in right ankle and joints of right foot: Secondary | ICD-10-CM

## 2021-11-24 DIAGNOSIS — M19071 Primary osteoarthritis, right ankle and foot: Secondary | ICD-10-CM

## 2021-11-24 DIAGNOSIS — M21611 Bunion of right foot: Secondary | ICD-10-CM

## 2021-11-24 DIAGNOSIS — M2041 Other hammer toe(s) (acquired), right foot: Secondary | ICD-10-CM | POA: Diagnosis not present

## 2021-11-24 NOTE — Progress Notes (Signed)
Subjective:  Patient ID: Tonya Robinson, female    DOB: 04-28-1941,  MRN: [id number]  Chief Complaint  Patient presents with   Foot Pain        right foot bunion/ hammertoes/ right ankle swelling after on feet all day    81 y.o. female presents with the above complaint. History confirmed with patient.  She is a long history of rheumatoid arthritis she is comanaged by her physician in [place] as well as locally.  Has been on methotrexate and Humira for some time.  Has pain over the bunion and in the hammertoes makes it difficult to wear certain shoes.  She also has swelling near the ankle that is quite painful as well.  Currently also dealing with right knee issues and arthritis and is seeing her orthopedic surgeon tomorrow to discuss possible surgery on the knee.  Objective:  Physical Exam: warm, good capillary refill, no trophic changes or ulcerative lesions, normal DP and PT pulses, normal sensory exam, and she does have severe hallux abductovalgus deformity with prominence of a bunion medially as well as semireducible hammertoe contractures 2 through 4 and adductovarus of the fifth toe.  Pain and crepitance on motion of the MTPJ which is limited.  Pain and edema over the sinus tarsi with limited range of motion that is painful of the subtalar   Radiographs: Multiple views x-ray of the right foot: Subchondral sclerosis and degenerative changes around the subtalar joint, there is severe hallux valgus deformity with large increase in intermetatarsal angle, tibial sesamoid position is a 6 or 7, severe abduction of the hallux there is diffuse osteopenia near complete degenerative changes of the first MTPJ and digital contractures with degenerative changes in the PIPJ as well. Assessment:   1. Bunion of right foot   2. Sinus tarsi syndrome, right   3. Hammertoe of right foot   4. Arthritis of right subtalar joint   5. Rheumatoid arthritis of right foot, unspecified whether rheumatoid factor  present [facility])      Plan:  Patient was evaluated and treated and all questions answered.  Discussed the etiology and treatment including surgical and non surgical treatment for painful bunions and hammertoes.  We also discussed the arthritis in her hindfoot and how this is a much more significant issue and would require a longer prolonged amount of surgery.  I do think long-term we could consider a more significant brace like an [place] or AFO to address this but would wait until after her knee and forefoot issues have been sorted out.  So far she has exhausted all non surgical treatment prior to this visit including shoe gear changes and padding.  She desires surgical intervention.  We discussed the postop recovery process for the proposed procedures which we discussed would be first metatarsal phalangeal joint arthrodesis with bone graft from the heel, second third and fourth metatarsal shortening osteotomies and second third fourth and possible fifth hammertoe correction.  We discussed the period of restricted and limited weightbearing in the recovery process for this.  Currently she lives alone and does not have family in the area that would help take care of her.  She will discuss with her family in [place] if it would be possible for them to stay with her for the first couple of weeks and have friends in the neighborhood that could help her with activities of daily living.  She will look into getting a dog walker for her dog after surgery.  She  is also planning to meet with her orthopedic surgeon tomorrow and discuss intervention for the right knee.  If he is able to get her in for surgery prior to the fall then I would recommend treating the knee first rehabbing it and then proceeding with right foot surgery.  She will let me know when she is able to proceed with the above and we will have her in for a presurgical planning visit for consent dispensation of the mood and addressing of final  questions and procedures.  Return for when ready to schedule surgery .

## 2021-11-25 ENCOUNTER — Telehealth: Payer: Self-pay | Admitting: *Deleted

## 2021-11-25 DIAGNOSIS — M25561 Pain in right knee: Secondary | ICD-10-CM | POA: Diagnosis not present

## 2021-11-25 DIAGNOSIS — M4316 Spondylolisthesis, lumbar region: Secondary | ICD-10-CM | POA: Diagnosis not present

## 2021-11-25 DIAGNOSIS — M545 Low back pain, unspecified: Secondary | ICD-10-CM | POA: Diagnosis not present

## 2021-11-25 DIAGNOSIS — M1711 Unilateral primary osteoarthritis, right knee: Secondary | ICD-10-CM | POA: Diagnosis not present

## 2021-11-25 NOTE — Telephone Encounter (Signed)
Patient is wanting to speak with physician to discuss scheduling surgery sometimes around Sept. She seen another doctor as recommended.

## 2021-12-03 ENCOUNTER — Ambulatory Visit (INDEPENDENT_AMBULATORY_CARE_PROVIDER_SITE_OTHER): Payer: Medicare Other | Admitting: Podiatry

## 2021-12-03 DIAGNOSIS — M069 Rheumatoid arthritis, unspecified: Secondary | ICD-10-CM | POA: Diagnosis not present

## 2021-12-03 DIAGNOSIS — M21611 Bunion of right foot: Secondary | ICD-10-CM

## 2021-12-03 DIAGNOSIS — E559 Vitamin D deficiency, unspecified: Secondary | ICD-10-CM

## 2021-12-03 DIAGNOSIS — M2011 Hallux valgus (acquired), right foot: Secondary | ICD-10-CM | POA: Diagnosis not present

## 2021-12-03 DIAGNOSIS — M2041 Other hammer toe(s) (acquired), right foot: Secondary | ICD-10-CM

## 2021-12-07 NOTE — Progress Notes (Signed)
Subjective:  Patient ID: Tonya Robinson, female    DOB: [birth year],  MRN: [id number]  Chief Complaint  Patient presents with   Bunions    Surgery consult    81 y.o. female presents with the above complaint. History confirmed with patient.  She is a long history of rheumatoid arthritis she is comanaged by her physician in [place] as well as locally.  Has been on methotrexate and Humira for some time.  Has pain over the bunion and in the hammertoes makes it difficult to wear certain shoes.  She also has swelling near the ankle that is quite painful as well.  Currently also dealing with right knee issues and arthritis and is seeing her orthopedic surgeon tomorrow to discuss possible surgery on the knee.   Interval History: She returns today for follow-up and for rediscussion of the surgical plan, she did discuss with her family and think she will be able to manage by having someone help her with her dogs and daily activities for the first few weeks after surgery.  Objective:  Physical Exam: warm, good capillary refill, no trophic changes or ulcerative lesions, normal DP and PT pulses, normal sensory exam, and she does have severe hallux abductovalgus deformity with prominence of a bunion medially as well as semireducible hammertoe contractures 2 through 4 and adductovarus of the fifth toe.  Pain and crepitance on motion of the MTPJ which is limited.  Pain and edema over the sinus tarsi with limited range of motion that is painful of the subtalar   Radiographs: Multiple views x-ray of the right foot: Subchondral sclerosis and degenerative changes around the subtalar joint, there is severe hallux valgus deformity with large increase in intermetatarsal angle, tibial sesamoid position is a 6 or 7, severe abduction of the hallux there is diffuse osteopenia near complete degenerative changes of the first MTPJ and digital contractures with degenerative changes in the PIPJ as well. Assessment:   1.  Hallux valgus with bunions, right   2. Hammertoe of right foot   3. Rheumatoid arthritis of right foot, unspecified whether rheumatoid factor present [facility])      Plan:  Patient was evaluated and treated and all questions answered.  Today we again discussed the etiology and treatment including surgical and non surgical treatment for painful bunions and hammertoes, so far she has exhausted all non surgical treatment prior to this visit including shoe gear changes and padding.  She desires surgical intervention. We discussed all risks including but not limited to: pain, swelling, infection, scar, numbness which may be temporary or permanent, chronic pain, stiffness, nerve pain or damage, wound healing problems, bone healing problems including delayed or non-union and recurrence.  Her right knee is being addressed with hyaluronic acid injections and her orthopedic surgeon has cleared her to proceed with foot surgery.  Specifically we discussed the following procedures:  first metatarsal phalangeal joint arthrodesis with bone graft from the heel, second third and fourth metatarsal shortening osteotomies and second third fourth and possible fifth hammertoe correction. Informed consent was signed today. Surgery will be scheduled at a mutually agreeable date. Information regarding this will be forwarded to our surgery scheduler.   Surgical plan:  Procedure: -Right foot first MPJ fusion, DMO 2, 3, 4, hammertoe correction 2, 3, 4, 5  Location: -[facility]  Anesthesia plan: -IV sedation with regional block  Postoperative pain plan: - Tylenol 1000 mg every 6 hours,gabapentin 300 mg every 8 hours x5 days, oxycodone 5 mg 1-2 tabs every 6  hours only as needed  DVT prophylaxis: -ASA '3 2 5 '$ mg twice daily  WB Restrictions / DME needs: -We will allow her to WBAT in a cam boot with focus on the heel.  Discussed that this is only for ambulation around the house for necessary activity, she will still need family  and friends to help her with dogs, errands groceries etc.  Boot was dispensed today   No follow-ups on file.

## 2021-12-23 DIAGNOSIS — M1711 Unilateral primary osteoarthritis, right knee: Secondary | ICD-10-CM | POA: Diagnosis not present

## 2021-12-30 DIAGNOSIS — M1711 Unilateral primary osteoarthritis, right knee: Secondary | ICD-10-CM | POA: Diagnosis not present

## 2022-01-07 DIAGNOSIS — H18453 Nodular corneal degeneration, bilateral: Secondary | ICD-10-CM | POA: Diagnosis not present

## 2022-01-07 DIAGNOSIS — H04123 Dry eye syndrome of bilateral lacrimal glands: Secondary | ICD-10-CM | POA: Diagnosis not present

## 2022-01-07 DIAGNOSIS — Z961 Presence of intraocular lens: Secondary | ICD-10-CM | POA: Diagnosis not present

## 2022-01-07 DIAGNOSIS — H35363 Drusen (degenerative) of macula, bilateral: Secondary | ICD-10-CM | POA: Diagnosis not present

## 2022-01-07 DIAGNOSIS — H18599 Other hereditary corneal dystrophies, unspecified eye: Secondary | ICD-10-CM | POA: Diagnosis not present

## 2022-01-08 DIAGNOSIS — Z79899 Other long term (current) drug therapy: Secondary | ICD-10-CM | POA: Diagnosis not present

## 2022-01-08 DIAGNOSIS — M0609 Rheumatoid arthritis without rheumatoid factor, multiple sites: Secondary | ICD-10-CM | POA: Diagnosis not present

## 2022-01-08 DIAGNOSIS — M1711 Unilateral primary osteoarthritis, right knee: Secondary | ICD-10-CM | POA: Diagnosis not present

## 2022-01-14 DIAGNOSIS — H16143 Punctate keratitis, bilateral: Secondary | ICD-10-CM | POA: Diagnosis not present

## 2022-01-21 DIAGNOSIS — Z23 Encounter for immunization: Secondary | ICD-10-CM | POA: Diagnosis not present

## 2022-01-21 DIAGNOSIS — M85822 Other specified disorders of bone density and structure, left upper arm: Secondary | ICD-10-CM | POA: Diagnosis not present

## 2022-01-21 DIAGNOSIS — E039 Hypothyroidism, unspecified: Secondary | ICD-10-CM | POA: Diagnosis not present

## 2022-01-21 DIAGNOSIS — I10 Essential (primary) hypertension: Secondary | ICD-10-CM | POA: Diagnosis not present

## 2022-01-21 DIAGNOSIS — F430 Acute stress reaction: Secondary | ICD-10-CM | POA: Diagnosis not present

## 2022-01-21 DIAGNOSIS — M85852 Other specified disorders of bone density and structure, left thigh: Secondary | ICD-10-CM | POA: Diagnosis not present

## 2022-01-21 DIAGNOSIS — M069 Rheumatoid arthritis, unspecified: Secondary | ICD-10-CM | POA: Diagnosis not present

## 2022-01-21 DIAGNOSIS — M2041 Other hammer toe(s) (acquired), right foot: Secondary | ICD-10-CM | POA: Diagnosis not present

## 2022-01-21 DIAGNOSIS — M2042 Other hammer toe(s) (acquired), left foot: Secondary | ICD-10-CM | POA: Diagnosis not present

## 2022-01-21 DIAGNOSIS — M21611 Bunion of right foot: Secondary | ICD-10-CM | POA: Diagnosis not present

## 2022-01-26 DIAGNOSIS — L821 Other seborrheic keratosis: Secondary | ICD-10-CM | POA: Diagnosis not present

## 2022-01-26 DIAGNOSIS — L814 Other melanin hyperpigmentation: Secondary | ICD-10-CM | POA: Diagnosis not present

## 2022-01-26 DIAGNOSIS — D1801 Hemangioma of skin and subcutaneous tissue: Secondary | ICD-10-CM | POA: Diagnosis not present

## 2022-02-01 DIAGNOSIS — M8589 Other specified disorders of bone density and structure, multiple sites: Secondary | ICD-10-CM | POA: Diagnosis not present

## 2022-02-01 DIAGNOSIS — Z780 Asymptomatic menopausal state: Secondary | ICD-10-CM | POA: Diagnosis not present

## 2022-02-01 DIAGNOSIS — S61412A Laceration without foreign body of left hand, initial encounter: Secondary | ICD-10-CM | POA: Diagnosis not present

## 2022-02-04 DIAGNOSIS — E559 Vitamin D deficiency, unspecified: Secondary | ICD-10-CM | POA: Diagnosis not present

## 2022-02-05 LAB — CALCIUM: Calcium: 9.5 mg/dL (ref 8.7–10.3)

## 2022-02-05 LAB — VITAMIN D 25 HYDROXY (VIT D DEFICIENCY, FRACTURES): Vit D, 25-Hydroxy: 96.8 ng/mL (ref 30.0–100.0)

## 2022-02-08 DIAGNOSIS — M542 Cervicalgia: Secondary | ICD-10-CM | POA: Diagnosis not present

## 2022-02-08 DIAGNOSIS — S61412D Laceration without foreign body of left hand, subsequent encounter: Secondary | ICD-10-CM | POA: Diagnosis not present

## 2022-02-09 ENCOUNTER — Telehealth: Payer: Self-pay

## 2022-02-09 NOTE — Telephone Encounter (Signed)
Tonya Robinson called to cancel her surgery with Dr. Sherryle Lis on 02/19/2022. She stated her RA is flared up and she wants to wait. Notified Dr. Sherryle Lis and Caren Griffins with [facility]

## 2022-02-22 DIAGNOSIS — Z23 Encounter for immunization: Secondary | ICD-10-CM | POA: Diagnosis not present

## 2022-02-25 ENCOUNTER — Encounter: Payer: Medicare Other | Admitting: Podiatry

## 2022-03-05 DIAGNOSIS — H04123 Dry eye syndrome of bilateral lacrimal glands: Secondary | ICD-10-CM | POA: Diagnosis not present

## 2022-03-11 ENCOUNTER — Encounter: Payer: Medicare Other | Admitting: Podiatry

## 2022-04-01 ENCOUNTER — Encounter: Payer: Medicare Other | Admitting: Podiatry

## 2022-04-07 DIAGNOSIS — Z23 Encounter for immunization: Secondary | ICD-10-CM | POA: Diagnosis not present

## 2022-04-09 DIAGNOSIS — R059 Cough, unspecified: Secondary | ICD-10-CM | POA: Diagnosis not present

## 2022-04-09 DIAGNOSIS — M0579 Rheumatoid arthritis with rheumatoid factor of multiple sites without organ or systems involvement: Secondary | ICD-10-CM | POA: Diagnosis not present

## 2022-04-09 DIAGNOSIS — Z79899 Other long term (current) drug therapy: Secondary | ICD-10-CM | POA: Diagnosis not present

## 2022-04-09 DIAGNOSIS — Z681 Body mass index (BMI) 19 or less, adult: Secondary | ICD-10-CM | POA: Diagnosis not present

## 2022-04-09 DIAGNOSIS — M353 Polymyalgia rheumatica: Secondary | ICD-10-CM | POA: Diagnosis not present

## 2022-04-09 DIAGNOSIS — M1991 Primary osteoarthritis, unspecified site: Secondary | ICD-10-CM | POA: Diagnosis not present

## 2022-04-20 DIAGNOSIS — D7589 Other specified diseases of blood and blood-forming organs: Secondary | ICD-10-CM | POA: Diagnosis not present

## 2022-04-20 DIAGNOSIS — R350 Frequency of micturition: Secondary | ICD-10-CM | POA: Diagnosis not present

## 2022-04-20 DIAGNOSIS — M545 Low back pain, unspecified: Secondary | ICD-10-CM | POA: Diagnosis not present

## 2022-05-05 ENCOUNTER — Ambulatory Visit: Payer: Medicare Other

## 2022-05-05 DIAGNOSIS — I351 Nonrheumatic aortic (valve) insufficiency: Secondary | ICD-10-CM | POA: Diagnosis not present

## 2022-05-11 ENCOUNTER — Encounter: Payer: Self-pay | Admitting: Cardiology

## 2022-05-11 ENCOUNTER — Ambulatory Visit: Payer: Medicare Other | Admitting: Cardiology

## 2022-05-11 VITALS — BP 122/62 | HR 77 | Temp 97.8°F | Resp 16 | Ht 61.0 in | Wt 97.4 lb

## 2022-05-11 DIAGNOSIS — I351 Nonrheumatic aortic (valve) insufficiency: Secondary | ICD-10-CM | POA: Diagnosis not present

## 2022-05-11 DIAGNOSIS — I10 Essential (primary) hypertension: Secondary | ICD-10-CM

## 2022-05-11 NOTE — Progress Notes (Signed)
Primary Physician/Referring:  Vernie Shanks, MD (Inactive)  Patient ID: Tonya Robinson, female    DOB: 1940-11-08, 81 y.o.   MRN: [id number]  Chief Complaint  Patient presents with   Moderate aortic regurgitation   Follow-up    1 year    HPI:    Tonya Robinson  is a 81 y.o.  Caucasian female with polymyalgia rheumatica, fibromyalgia, Raynaud's disease, mild hyperlipidemia, hypertension, rheumatoid arthritis, chronic mild dyspnea on exertion, moderate aortic regurgitation, normal coronary arteries in 2019 presents here for annual visit.   She is doing well and no significant change in her symptoms.  No PND or orthopnea, no leg edema.  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   Past Surgical History:  Procedure Laterality Date   ABDOMINAL HYSTERECTOMY     BREAST LUMPECTOMY Right    KNEE CARTILAGE SURGERY Right    RIGHT/LEFT HEART CATH AND CORONARY ANGIOGRAPHY N/A 07/08/2017   Procedure: RIGHT/LEFT HEART CATH AND CORONARY ANGIOGRAPHY;  Surgeon: Adrian Prows, MD;  Location: [facility];  Service: Cardiovascular;  Laterality: N/A;   Family History  Problem Relation Age of Onset   Hypertension Mother    Arthritis Mother    Heart disease Father    Arthritis Father    Heart failure Brother    Stroke Brother     Social History   Tobacco Use   Smoking status: Former    Packs/day: 0.25    Years: 15.00    Total pack years: 3.75    Types: Cigarettes   Smokeless tobacco: Never  Substance Use Topics   Alcohol use: Yes    Alcohol/week: 1.0 standard drink of alcohol    Types: 1 Glasses of wine per week    Comment: occasionally   Marital Status: Divorced  ROS  Review of Systems  Cardiovascular:  Positive for dyspnea on exertion. Negative for chest pain and leg swelling.   Gastrointestinal:  Negative for melena.  Neurological:  Positive for paresthesias (upper extremity).   Objective  Blood pressure 122/62, pulse 77, temperature 97.8 F (36.6 C), temperature source Temporal, resp. rate 16, height _0  (1.549 m), weight 97 lb 6.4 oz (44.2 kg), SpO2 96 %.     05/11/2022   10:01 AM 05/11/2021    9:58 AM 05/09/2020   10:22 AM  Vitals with BMI  Height _1  _2  _3   Weight 97 lbs 6 oz 95 lbs 10 oz 94 lbs  BMI 18.41 86.76 72.09  Systolic 470 962 836  Diastolic 62 59 61  Pulse 77 70 87     Physical Exam Neck:     Vascular: No carotid bruit or JVD.  Cardiovascular:     Rate and Rhythm: Normal rate and regular rhythm.     Pulses: Normal pulses and intact distal pulses.     Heart sounds: Murmur heard.     Systolic murmur is present.     High-pitched blowing decrescendo early diastolic murmur is present with a grade of 2/4 at the upper right sternal border radiating to the apex.     No gallop.  Pulmonary:     Effort: Pulmonary effort is normal.     Breath sounds: Normal breath sounds.  Abdominal:     General:  Bowel sounds are normal.     Palpations: Abdomen is soft.  Musculoskeletal:     Right lower leg: No edema.     Left lower leg: No edema.    Laboratory examination:   External labs:   Labs 06/20/2022:  Hb 11.4/HCT 34.9, platelets 252.  Normal indicis.  Serum glucose 105 mg, BUN 24, creatinine 0.96, EGFR 60 mL, potassium 4.5, LFTs normal.  Labs 05/04/2021: Hb 12.3/HCT 36.3, platelets 261, normal indicis.  Serum glucose 99, BUN 12, creatinine 0.81, potassium 4.5.  TSH normal at 2.92.  Cholesterol, total 224.000 m 11/01/2017 HDL 103.000 m 11/01/2017 LDL 109.000 m 11/01/2017 Triglycerides 59.000 mg 11/01/2017  Medications and allergies   Allergies  Allergen Reactions   Valtrex [Valacyclovir]    Other Other (See Comments)   Penicillins Rash    Childhood allergy Has patient had a PCN reaction causing immediate rash,  facial/tongue/throat swelling, SOB or lightheadedness with hypotension: Yes Has patient had a PCN reaction causing severe rash involving mucus membranes or skin necrosis: No Has patient had a PCN reaction that required hospitalization: No Has patient had a PCN reaction occurring within the last 10 years: No If all of the above answers are "NO", then may proceed with Cephalosporin use.      Current Outpatient Medications:    Acetaminophen 500 MG capsule, Take 1-2 capsules by mouth every 6 (six) hours as needed for pain., Disp: , Rfl:    Adalimumab 40 MG/0.4ML PSKT, Inject 40 mg into the skin every 14 (fourteen) days. On fridays, Disp: , Rfl:    ALPRAZolam (XANAX) 0.25 MG tablet, Take 1 tab tid prn anxiety and hs prn insomnia. (Patient taking differently: Take 0.25 mg by mouth daily as needed for anxiety.), Disp: 60 tablet, Rfl: 0   amLODipine (NORVASC) 2.5 MG tablet, Take 2.5 mg by mouth daily., Disp: , Rfl:    Calcium Carbonate-Vitamin D (CALCIUM 600+D PO), Take 1 tablet by mouth 3 (three) times daily., Disp: , Rfl:    folic acid (FOLVITE) 1 MG tablet, Take 2 mg by mouth daily., Disp: , Rfl:    furosemide (LASIX) 20 MG tablet, Take 20 mg by mouth as needed., Disp: , Rfl:    hydrOXYzine (ATARAX) 25 MG tablet, Take 25 mg by mouth at bedtime as needed for anxiety., Disp: , Rfl:    levothyroxine (SYNTHROID, LEVOTHROID) 25 MCG tablet, Take 1 tablet (25 mcg total) by mouth daily before breakfast., Disp: 90 tablet, Rfl: 2   METHOTREXATE PO, Take 40 mg/m2 by mouth every Monday. Caution:Chemotherapy. Protect from light. Takes 8 tablets weekly, Disp: , Rfl:    metoprolol succinate (TOPROL-XL) 25 MG 24 hr tablet, Take 12.5 mg by mouth daily. , Disp: , Rfl:    predniSONE (DELTASONE) 1 MG tablet, Take 3 mg by mouth daily. , Disp: , Rfl:    erythromycin (ERY-TAB) 500 MG EC tablet, Take 1,000 mg by mouth See admin instructions. Take 1000 mg 1 hour prior to dental procedures (Patient not taking: Reported on  05/11/2022), Disp: , Rfl:     Radiology:   No results found.  Cardiac Studies:   Nuclear stress test   [01/07/2017]: 01/07/2017 1. The resting electrocardiogram demonstrated normal sinus rhythm, normal resting conduction, no resting arrhythmias and normal rest repolarization. The stress electrocardiogram was positive for ischemia with 2 mm horizontal ST depression noed at peak exercise persisting for > 2 minutes into recovery. Stress symptoms included dyspnea and chest pressure. Patient exercised on Bruce protocol for 7:00 minutes and  achieved 8.55 METS. Stress test terminated due to chest pressure, dyspnea and 88% MPHR achieved (Target HR >85%). 2. Myocardial perfusion imaging is normal. Overall left ventricular systolic function was normal without regional wall motion abnormalities. The left ventricular ejection fraction was 80%. This is an intermediate risk study, clinical correlation recommended in view of symptoms and abnormal EKG.  Left and right heart catheterization 07/08/2017:  Normal coronary arteries and right heart pressure.  PCV ECHOCARDIOGRAM COMPLETE 05/05/2022  Narrative Echocardiogram 05/05/2022: Normal LV systolic function with visual EF 55-60%. Left ventricle cavity is normal in size. Normal left ventricular wall thickness. Normal global wall motion. Doppler evidence of grade I (impaired) diastolic dysfunction, normal LAP. Calculated EF 62%. Native trileaflet aortic valve.  Moderate to severe aortic regurgitation. Native mitral valve.  Mild to moderate mitral regurgitation. Structurally normal tricuspid valve.  Mild tricuspid regurgitation. No evidence of pulmonary hypertension. Compared to 01/2017, aortic regurgitation has progressed.   EKG:   EKG 05/11/2022: Normal sinus rhythm at rate of 73 bpm, normal axis, no evidence of ischemia.  Normal EKG. Compared to 05/11/2021, no significant change.   Assessment     ICD-10-CM   1. Primary hypertension  I10 EKG 12-Lead    2.  Moderate aortic regurgitation  I35.1 PCV ECHOCARDIOGRAM COMPLETE      Medications Discontinued During This Encounter  Medication Reason   Omega-3 Fatty Acids (FISH OIL) 1200 MG CAPS    ibuprofen (ADVIL,MOTRIN) 200 MG tablet    FLUoxetine (PROZAC) 10 MG capsule      No orders of the defined types were placed in this encounter.  Recommendations:   Tonya Robinson is a 81 y.o. Caucasian female with polymyalgia rheumatica, fibromyalgia, Raynaud's disease, mild hyperlipidemia, hypertension, rheumatoid arthritis, chronic mild dyspnea on exertion, moderate aortic regurgitation, normal coronary arteries in 2019 presents here for annual visit.   She is doing well and no significant change in her symptoms.  No PND or orthopnea, no leg edema.  1. Primary hypertension Blood pressure is very well controlled on a small dose of amlodipine and also she uses this for her Raynaud's.  2. Moderate aortic regurgitation No change in physical exam, very soft 9-3/2 early diastolic murmur.  Although echocardiogram reveals moderately severe aortic regurgitation, no clinical evidence of heart failure, no change in the EKG, we will continue to monitor this on annual basis with echocardiogram and I will see her back then.  With regard to hypercholesterolemia, very mild hyperlipidemia, she needs follow-up labs.  If LDL is trending up, could consider a small dose of atorvastatin or even simvastatin 10 or 20 mg.    Adrian Prows, MD, [facility] 05/11/2022, 10:39 AM Office: [phone]

## 2022-05-14 ENCOUNTER — Ambulatory Visit: Payer: Medicare Other | Admitting: Cardiology

## 2022-05-31 DIAGNOSIS — Z0000 Encounter for general adult medical examination without abnormal findings: Secondary | ICD-10-CM | POA: Diagnosis not present

## 2022-05-31 DIAGNOSIS — Z1389 Encounter for screening for other disorder: Secondary | ICD-10-CM | POA: Diagnosis not present

## 2022-05-31 DIAGNOSIS — Z681 Body mass index (BMI) 19 or less, adult: Secondary | ICD-10-CM | POA: Diagnosis not present

## 2022-06-02 DIAGNOSIS — R8290 Unspecified abnormal findings in urine: Secondary | ICD-10-CM | POA: Diagnosis not present

## 2022-06-02 DIAGNOSIS — M545 Low back pain, unspecified: Secondary | ICD-10-CM | POA: Diagnosis not present

## 2022-06-14 DIAGNOSIS — Z79899 Other long term (current) drug therapy: Secondary | ICD-10-CM | POA: Diagnosis not present

## 2022-06-21 ENCOUNTER — Ambulatory Visit (INDEPENDENT_AMBULATORY_CARE_PROVIDER_SITE_OTHER): Payer: Medicare Other | Admitting: Podiatry

## 2022-06-21 DIAGNOSIS — M21611 Bunion of right foot: Secondary | ICD-10-CM

## 2022-06-21 DIAGNOSIS — M069 Rheumatoid arthritis, unspecified: Secondary | ICD-10-CM

## 2022-06-21 DIAGNOSIS — M2011 Hallux valgus (acquired), right foot: Secondary | ICD-10-CM | POA: Diagnosis not present

## 2022-06-21 DIAGNOSIS — M2041 Other hammer toe(s) (acquired), right foot: Secondary | ICD-10-CM | POA: Diagnosis not present

## 2022-06-22 NOTE — Progress Notes (Signed)
  Subjective:  Patient ID: Tonya Robinson, female    DOB: Dec 08, 1940,  MRN: [id number]  Chief Complaint  Patient presents with   Bunions    right foot eval to possible reschedule surgery that had to be canceled    81 y.o. female presents with the above complaint. History confirmed with patient.  She is a long history of rheumatoid arthritis she is comanaged by her physician in [place] as well as locally.  Has been on methotrexate and Humira for some time.  Has pain over the bunion and in the hammertoes makes it difficult to wear certain shoes.  She also has swelling near the ankle that is quite painful as well.  Currently also dealing with right knee issues and arthritis and is seeing her orthopedic surgeon tomorrow to discuss possible surgery on the knee.   Interval History: She returns today to reschedule her for surgery, she had to cancel her last planned date for this due to lack of support from family numbers but she is able to proceed with this at this time due to help with neighbors and a significant there  Objective:  Physical Exam: warm, good capillary refill, no trophic changes or ulcerative lesions, normal DP and PT pulses, normal sensory exam, and she does have severe hallux abductovalgus deformity with prominence of a bunion medially as well as semireducible hammertoe contractures 2 through 4 and adductovarus of the fifth toe.  Pain and crepitance on motion of the MTPJ which is limited.  Pain and edema over the sinus tarsi with limited range of motion that is painful of the subtalar   Radiographs: Multiple views x-ray of the right foot: Subchondral sclerosis and degenerative changes around the subtalar joint, there is severe hallux valgus deformity with large increase in intermetatarsal angle, tibial sesamoid position is a 6 or 7, severe abduction of the hallux there is diffuse osteopenia near complete degenerative changes of the first MTPJ and digital contractures with degenerative  changes in the PIPJ as well. Assessment:   1. Hallux valgus with bunions, right   2. Hammertoe of right foot   3. Rheumatoid arthritis of right foot, unspecified whether rheumatoid factor present [facility])      Plan:  Patient was evaluated and treated and all questions answered.  Today we again discussed the surgical plan, specifically we discussed the following procedures:  first metatarsal phalangeal joint arthrodesis with bone graft from the heel, second third and fourth metatarsal shortening osteotomies and second third fourth and possible fifth hammertoe correction. Informed consent was re-signed today. Surgery will be scheduled at a mutually agreeable date. Information regarding this will be forwarded to our surgery scheduler.   Surgical plan:  Procedure: -Right foot first MPJ fusion, DMO 2, 3, 4, hammertoe correction 2, 3, 4, 5  Location: -[facility]  Anesthesia plan: -IV sedation with regional block  Postoperative pain plan: - Tylenol 1000 mg every 6 hours,gabapentin 300 mg every 8 hours x5 days, oxycodone 5 mg 1-2 tabs every 6 hours only as needed  DVT prophylaxis: -ASA 325 mg twice daily  WB Restrictions / DME needs: -We will allow her to WBAT in a cam boot with focus on the heel.  Discussed that this is only for ambulation around the house for necessary activity, she will still need family and friends to help her with dogs, errands groceries etc.    No follow-ups on file.

## 2022-09-10 ENCOUNTER — Other Ambulatory Visit: Payer: Self-pay | Admitting: Podiatry

## 2022-09-10 DIAGNOSIS — M7741 Metatarsalgia, right foot: Secondary | ICD-10-CM | POA: Diagnosis not present

## 2022-09-10 DIAGNOSIS — M89771 Major osseous defect, right ankle and foot: Secondary | ICD-10-CM | POA: Diagnosis not present

## 2022-09-10 DIAGNOSIS — M2041 Other hammer toe(s) (acquired), right foot: Secondary | ICD-10-CM | POA: Diagnosis not present

## 2022-09-10 DIAGNOSIS — M2011 Hallux valgus (acquired), right foot: Secondary | ICD-10-CM | POA: Diagnosis not present

## 2022-09-10 MED ORDER — ACETAMINOPHEN 500 MG PO TABS: 1000.0000 mg | ORAL_TABLET | Freq: Four times a day (QID) | ORAL | 0 refills | Status: AC | PRN

## 2022-09-10 MED ORDER — ASPIRIN 325 MG PO TBEC: 325.0000 mg | DELAYED_RELEASE_TABLET | Freq: Two times a day (BID) | ORAL | 0 refills | Status: AC

## 2022-09-10 MED ORDER — GABAPENTIN 300 MG PO CAPS: 300.0000 mg | ORAL_CAPSULE | Freq: Three times a day (TID) | ORAL | 0 refills | Status: AC

## 2022-09-10 MED ORDER — OXYCODONE HCL 5 MG PO TABS: 5.0000 mg | ORAL_TABLET | ORAL | 0 refills | Status: AC | PRN

## 2022-09-10 NOTE — Progress Notes (Signed)
09/10/22 R 1st MPJ fusion, metatarsal osteotomy 2/3/4, hammertoe 2/3/4/5

## 2022-09-16 ENCOUNTER — Ambulatory Visit (INDEPENDENT_AMBULATORY_CARE_PROVIDER_SITE_OTHER): Payer: Medicare Other

## 2022-09-16 ENCOUNTER — Ambulatory Visit (INDEPENDENT_AMBULATORY_CARE_PROVIDER_SITE_OTHER): Payer: Medicare Other | Admitting: Podiatry

## 2022-09-16 ENCOUNTER — Encounter: Payer: Self-pay | Admitting: Podiatry

## 2022-09-16 DIAGNOSIS — M2011 Hallux valgus (acquired), right foot: Secondary | ICD-10-CM | POA: Diagnosis not present

## 2022-09-16 DIAGNOSIS — M21611 Bunion of right foot: Secondary | ICD-10-CM | POA: Diagnosis not present

## 2022-09-16 DIAGNOSIS — M2041 Other hammer toe(s) (acquired), right foot: Secondary | ICD-10-CM

## 2022-09-16 NOTE — Progress Notes (Signed)
  Subjective:  Patient ID: Tonya Robinson, female    DOB: March 22, 1941,  MRN: [id number]  Chief Complaint  Patient presents with   Routine Post Op    POV #1 DOS 09/10/22 --- RIGHT FOOT GREAT TOE FUSION, BONE GRAFT FROM HEEL,BONE CUTS IN METATARSALS 2-4, HAMMERTOE CORRECTION 2-5 RIGHT     82 y.o. female returns for post-op check.  She is doing well she is pleased is not having much pain  Review of Systems: Negative except as noted in the HPI. Denies N/V/F/Ch.   Objective:  There were no vitals filed for this visit. There is no height or weight on file to calculate BMI. Constitutional Well developed. Well nourished.  Vascular Foot warm and well perfused. Capillary refill normal to all digits.  Calf is soft and supple, no posterior calf or knee pain, negative Homans' sign  Neurologic Normal speech. Oriented to person, place, and time. Epicritic sensation to light touch grossly present bilaterally.  Dermatologic Skin healing well without signs of infection. Skin edges well coapted without signs of infection.  Orthopedic: Tenderness to palpation noted about the surgical site.   Multiple view plain film radiographs: Good correction noted all hardware intact and equivalent to immediate postop films Assessment:   1. Hallux valgus with bunions, right   2. Hammertoe of right foot    Plan:  Patient was evaluated and treated and all questions answered.  S/p foot surgery right -Progressing as expected post-operatively.  We discussed that intraoperatively her bone was quite soft, discussed this may take longer to heal and may need to persist in CAM boot for longer period of time pending her progress -XR: Noted above no complication noted -WB Status: WBAT in cam walker boot to heel, advised to minimize walking is much as possible and rest is much as possible -Sutures: She will return in 2 weeks for follow-up and removal of sutures. -Medications: No refills required she will continue her ASA 325  mg twice daily -Foot redressed.  Return in about 2 weeks (around 09/30/2022) for suture removal.

## 2022-09-17 ENCOUNTER — Telehealth: Payer: Self-pay | Admitting: Podiatry

## 2022-09-17 NOTE — Telephone Encounter (Signed)
Patient stated that she was seen yesterday, She wanted to know if she can go out to dinner with some friends on Sunday siunce its her bday. She wanted to know if she goes out for a couple of hours would it be ok to have her foot down for a few hours and not have an ice pack on it.   Please advise

## 2022-09-28 ENCOUNTER — Encounter: Payer: Self-pay | Admitting: Podiatry

## 2022-09-30 ENCOUNTER — Ambulatory Visit (INDEPENDENT_AMBULATORY_CARE_PROVIDER_SITE_OTHER): Payer: Medicare Other | Admitting: Podiatry

## 2022-09-30 DIAGNOSIS — M21611 Bunion of right foot: Secondary | ICD-10-CM

## 2022-09-30 DIAGNOSIS — M2011 Hallux valgus (acquired), right foot: Secondary | ICD-10-CM

## 2022-09-30 DIAGNOSIS — M2041 Other hammer toe(s) (acquired), right foot: Secondary | ICD-10-CM

## 2022-09-30 NOTE — Progress Notes (Signed)
  Subjective:  Patient ID: Tonya Robinson, female    DOB: December 28, 1940,  MRN: [id number]  Chief Complaint  Patient presents with   Routine Post Op    POV #2 DOS 09/10/22 --- RIGHT FOOT GREAT TOE FUSION, BONE GRAFT FROM HEEL,BONE CUTS IN METATARSALS 2-4, HAMMERTOE CORRECTION 2-5 RIGHT, Rahil Passey PT     82 y.o. female returns for post-op check.  Overall doing well continues to improve and pain  Review of Systems: Negative except as noted in the HPI. Denies N/V/F/Ch.   Objective:  There were no vitals filed for this visit. There is no height or weight on file to calculate BMI. Constitutional Well developed. Well nourished.  Vascular Foot warm and well perfused. Capillary refill normal to all digits.  Calf is soft and supple, no posterior calf or knee pain, negative Homans' sign  Neurologic Normal speech. Oriented to person, place, and time. Epicritic sensation to light touch grossly present bilaterally.  Dermatologic Incisions are healed on hypertrophic  Orthopedic: Tenderness to palpation noted about the surgical site.  Edema improving   Multiple view plain film radiographs: Good correction noted all hardware intact and equivalent to immediate postop films Assessment:   1. Hallux valgus with bunions, right   2. Hammertoe of right foot    Plan:  Patient was evaluated and treated and all questions answered.  S/p foot surgery right -Overall doing well.  All sutures removed uneventfully.  She may begin showering and apply Neosporin around the pin sites before and after.  Dressed with Ace wrap.  I will see her back in 3 weeks for new radiographs and pin removal.  No follow-ups on file.

## 2022-10-05 ENCOUNTER — Telehealth: Payer: Self-pay | Admitting: *Deleted

## 2022-10-05 NOTE — Telephone Encounter (Signed)
Patient is calling because her right foot is red and swollen, can she start soaking in warm water? please advise. Her appointment is in 3-4 weeks.

## 2022-10-06 NOTE — Telephone Encounter (Signed)
Called and left voice message of physician's recommendations and to call back if questions.

## 2022-10-21 ENCOUNTER — Ambulatory Visit (INDEPENDENT_AMBULATORY_CARE_PROVIDER_SITE_OTHER): Payer: Medicare Other | Admitting: Podiatry

## 2022-10-21 ENCOUNTER — Ambulatory Visit (INDEPENDENT_AMBULATORY_CARE_PROVIDER_SITE_OTHER): Payer: Medicare Other

## 2022-10-21 DIAGNOSIS — M2041 Other hammer toe(s) (acquired), right foot: Secondary | ICD-10-CM

## 2022-10-21 DIAGNOSIS — M21611 Bunion of right foot: Secondary | ICD-10-CM

## 2022-10-21 DIAGNOSIS — M2011 Hallux valgus (acquired), right foot: Secondary | ICD-10-CM

## 2022-10-21 NOTE — Progress Notes (Signed)
  Subjective:  Patient ID: Tonya Robinson, female    DOB: 12-23-1940,  MRN: [id number]  Chief Complaint  Patient presents with   Routine Post Op    POV #2 DOS 09/10/22 --- RIGHT FOOT GREAT TOE FUSION, BONE GRAFT FROM HEEL,BONE CUTS IN METATARSALS 2-4, HAMMERTOE CORRECTION 2-5 RIGHT, Tonya Robinson PT     82 y.o. female returns for post-op check.  She is doing well her pain and swelling continues to improve  Review of Systems: Negative except as noted in the HPI. Denies N/V/F/Ch.   Objective:  There were no vitals filed for this visit. There is no height or weight on file to calculate BMI. Constitutional Well developed. Well nourished.  Vascular Foot warm and well perfused. Capillary refill normal to all digits.  Calf is soft and supple, no posterior calf or knee pain, negative Homans' sign  Neurologic Normal speech. Oriented to person, place, and time. Epicritic sensation to light touch grossly present bilaterally.  Dermatologic Incisions are healed  and not hypertrophic  Orthopedic: Minimal edema, no pain today   Multiple view plain film radiographs: Correction is maintained, there is good consolidation across the osteotomy and fusion sites Assessment:   1. Hallux valgus with bunions, right   2. Hammertoe of right foot    Plan:  Patient was evaluated and treated and all questions answered.  S/p foot surgery right -Kirschner wires removed uneventfully today.  Band-Aids applied.  She will transition to a surgical shoe.  May begin to gradually transition back to regular shoe gear starting no sooner than 2 weeks from now may take up to 4 weeks to do this.  I will see her back in 6 weeks for new radiographs, should be able to be discharged from care at that point.  She is going to be moving to [place] soon.  Appears to be healing well without complication.  No follow-ups on file.

## 2022-11-18 ENCOUNTER — Ambulatory Visit (INDEPENDENT_AMBULATORY_CARE_PROVIDER_SITE_OTHER): Payer: Medicare Other

## 2022-11-18 ENCOUNTER — Ambulatory Visit (INDEPENDENT_AMBULATORY_CARE_PROVIDER_SITE_OTHER): Payer: Medicare Other | Admitting: Podiatry

## 2022-11-18 DIAGNOSIS — M2041 Other hammer toe(s) (acquired), right foot: Secondary | ICD-10-CM

## 2022-11-18 DIAGNOSIS — M21611 Bunion of right foot: Secondary | ICD-10-CM

## 2022-11-18 DIAGNOSIS — M2011 Hallux valgus (acquired), right foot: Secondary | ICD-10-CM | POA: Diagnosis not present

## 2022-11-18 NOTE — Patient Instructions (Signed)
Shoes I like for you:  Sneakers - Hulan Amato, [employer]  Non-sneakers: [employer], [employer]

## 2022-11-22 NOTE — Progress Notes (Signed)
  Subjective:  Patient ID: Tonya Robinson, female    DOB: [birth year],  MRN: [id number]  Chief Complaint  Patient presents with   Routine Post Op    POV #4 DOS 09/10/22 --- RIGHT FOOT GREAT TOE FUSION, BONE GRAFT FROM HEEL,BONE CUTS IN METATARSALS 2-4, HAMMERTOE CORRECTION 2-5 RIGHT     82 y.o. female returns for post-op check.  She is doing well not having much pain, still has some swelling, she has been on her foot quite a lot she is moving to [place]  Review of Systems: Negative except as noted in the HPI. Denies N/V/F/Ch.   Objective:  There were no vitals filed for this visit. There is no height or weight on file to calculate BMI. Constitutional Well developed. Well nourished.  Vascular Foot warm and well perfused. Capillary refill normal to all digits.  Calf is soft and supple, no posterior calf or knee pain, negative Homans' sign  Neurologic Normal speech. Oriented to person, place, and time. Epicritic sensation to light touch grossly present bilaterally.  Dermatologic Incisions are healed  and not hypertrophic  Orthopedic: Minimal edema, no pain today.  Good correction noted no recurrence of deformity   Multiple view plain film radiographs: Correction is maintained, there is complete consolidation across the osteotomy and fusion sites Assessment:   1. Hallux valgus with bunions, right   2. Hammertoe of right foot    Plan:  Patient was evaluated and treated and all questions answered.  S/p foot surgery right -Overall doing very well.  She has not fully healed at this point.  Advised some occasional pains and tenderness may continue as she has been very active which at this point her bone healing should support this.  She may be in regular shoe gear and return to full activity.  She will return to see me as needed for this fall when she is back in the [place] area  Return if symptoms worsen or fail to improve.

## 2022-12-02 ENCOUNTER — Encounter: Payer: Medicare Other | Admitting: Podiatry

## 2023-01-31 ENCOUNTER — Other Ambulatory Visit: Payer: Self-pay | Admitting: Podiatry

## 2023-04-18 ENCOUNTER — Telehealth (HOSPITAL_COMMUNITY): Payer: Self-pay | Admitting: Cardiology

## 2023-04-18 NOTE — Telephone Encounter (Signed)
I called to reschedule echocardiogram ordered by Dr. Jacinto Halim, but patient declined to schedule and states she is in the middle of relocating. She will call back if she decides to have test. Order will be removed from the echo wq. Thank you.

## 2023-05-02 ENCOUNTER — Other Ambulatory Visit (HOSPITAL_COMMUNITY): Payer: PRIVATE HEALTH INSURANCE

## 2023-05-02 ENCOUNTER — Other Ambulatory Visit: Payer: Medicare Other

## 2023-05-12 ENCOUNTER — Ambulatory Visit: Payer: Medicare Other | Admitting: Cardiology
